# Patient Record
Sex: Male | Born: 1960 | Race: White | Hispanic: No | Marital: Married | Smoking: Former smoker
Health system: Southern US, Community
[De-identification: ages and names within clinical notes are randomized; demographics above are authoritative.]

## PROBLEM LIST (undated history)

## (undated) DIAGNOSIS — M722 Plantar fascial fibromatosis: Secondary | ICD-10-CM

## (undated) DIAGNOSIS — M1990 Unspecified osteoarthritis, unspecified site: Secondary | ICD-10-CM

## (undated) DIAGNOSIS — N182 Chronic kidney disease, stage 2 (mild): Secondary | ICD-10-CM

## (undated) DIAGNOSIS — M549 Dorsalgia, unspecified: Secondary | ICD-10-CM

## (undated) HISTORY — DX: Plantar fascial fibromatosis: M72.2

## (undated) HISTORY — DX: Dorsalgia, unspecified: M54.9

## (undated) HISTORY — DX: Unspecified osteoarthritis, unspecified site: M19.90

## (undated) HISTORY — DX: Chronic kidney disease, stage 2 (mild): N18.2

---

## 2008-06-12 ENCOUNTER — Emergency Department: Payer: Self-pay | Admitting: Internal Medicine

## 2008-12-10 ENCOUNTER — Ambulatory Visit: Payer: Self-pay | Admitting: General Practice

## 2010-03-24 ENCOUNTER — Inpatient Hospital Stay: Payer: Self-pay | Admitting: Internal Medicine

## 2010-03-28 ENCOUNTER — Ambulatory Visit: Payer: Self-pay | Admitting: Anesthesiology

## 2010-04-18 ENCOUNTER — Ambulatory Visit: Payer: Self-pay | Admitting: Anesthesiology

## 2010-05-17 ENCOUNTER — Ambulatory Visit: Payer: Self-pay | Admitting: Anesthesiology

## 2010-11-28 ENCOUNTER — Ambulatory Visit: Payer: Self-pay | Admitting: Anesthesiology

## 2011-11-05 ENCOUNTER — Ambulatory Visit: Payer: Self-pay | Admitting: Anesthesiology

## 2012-07-31 ENCOUNTER — Ambulatory Visit: Payer: Self-pay | Admitting: Anesthesiology

## 2013-03-18 ENCOUNTER — Ambulatory Visit: Payer: Self-pay | Admitting: General Practice

## 2014-04-13 ENCOUNTER — Ambulatory Visit
Admit: 2014-04-13 | Disposition: A | Discharge status: Home / Self Care | Payer: Self-pay | Attending: General Practice | Admitting: General Practice

## 2014-05-01 NOTE — Op Note (Signed)
PATIENT NAME:  Troy Hogan, Troy Hogan MR#:  [id number] DATE OF BIRTH:  January 07, 1961  DATE OF PROCEDURE:  03/18/2013  CONSULTING PHYSICIAN: Elby Blackwelder D. Clayborn Bigness, M.D.   INDICATION: Irregular heart beat and palpitations.   The patient wore the Holter for 48 hours. Total beats 234,024, minimum rate 49, maximum 161. Average 83, mostly sinus rhythm, rare PVCs, rare PACs. No runs. No pauses. No high-grade block. No ST segment changes.   IMPRESSION: Relatively benign Holter. No diary was submitted, would recommend conservative therapy at this point unless symptoms persist, worsen or recur.    ____________________________ Loran Senters. Clayborn Bigness, MD ddc:sg D: 03/21/2013 11:44:52 ET T: 03/22/2013 06:48:05 ET JOB#: [id number]  cc: Aasia Peavler D. Clayborn Bigness, MD, <Dictator> Yolonda Kida MD ELECTRONICALLY SIGNED 04/06/2013 7:06

## 2015-02-17 ENCOUNTER — Encounter: Payer: Self-pay | Admitting: Physician Assistant

## 2015-02-17 ENCOUNTER — Ambulatory Visit: Payer: Self-pay | Admitting: Physician Assistant

## 2015-02-17 VITALS — BP 110/70 | HR 81 | Temp 97.3°F

## 2015-02-17 DIAGNOSIS — M545 Low back pain, unspecified: Secondary | ICD-10-CM

## 2015-02-17 MED ORDER — METHYLPREDNISOLONE 4 MG PO TBPK: ORAL_TABLET | ORAL | Status: AC

## 2015-02-17 NOTE — Progress Notes (Signed)
S:  C/o low back pain for 3-4 days, no known injury, pain is worse with movement, increased with bending over, denies numbness, tingling, or changes in bowel/urinary habits,  Had uri last week with fever, chills, cough, thinks he coughed so much he pulled his back, Using otc meds without relief Remainder ros neg  O:  Vitals wnl, nad, ent wnl;  lungs c t a, cv rrr, spine nontender, muscles in lower back spasmed , decreased rom with,  Neg slr, pt walks without difficulty, no foot drop noted, n/v intact  A: acute back pain, muscle spasms  P: medrol dose pack, use wet heat followed by ice, stretches, return to clinic if not better in 3 t 5 days, return earlier if worsening

## 2016-05-23 ENCOUNTER — Other Ambulatory Visit: Payer: Self-pay

## 2016-05-23 VITALS — BP 130/80 | HR 78 | Temp 98.5°F | Ht 71.0 in | Wt 172.0 lb

## 2016-05-23 DIAGNOSIS — Z0000 Encounter for general adult medical examination without abnormal findings: Secondary | ICD-10-CM

## 2016-05-23 DIAGNOSIS — Z0189 Encounter for other specified special examinations: Secondary | ICD-10-CM

## 2016-05-23 DIAGNOSIS — Z008 Encounter for other general examination: Secondary | ICD-10-CM

## 2016-05-23 NOTE — Progress Notes (Signed)
S: pt here for wellness physical and biometrics for insurance purposes, states over the past 4 weeks he has had 4 episodes where he feels like he is going to black out then feels disoriented,  no other complaints ros neg. PMH:  Back pain which has improved with exercise  Social: former smoker, occ dip, 3-4 beers a week  Fam: father died of cirrhosis, was an alcoholic, mother is still living has dm, and htn, 1 sister and 2 brothers both have htn  O: vitals wnl, nad, ENT wnl, neck supple no lymph, lungs c t a, cv rrr, abd soft nontender bs normal all 4 quads  A: wellness, biometric physical  P: fasting labs, if glucose and A1C are normal will US carotids to check for stenosis

## 2016-05-28 LAB — CMP12+LP+TP+TSH+6AC+PSA+CBC…
ALBUMIN: 4.8 g/dL (ref 3.5–5.5)
ALK PHOS: 54 IU/L (ref 39–117)
ALT: 12 IU/L (ref 0–44)
AST: 15 IU/L (ref 0–40)
Albumin/Globulin Ratio: 2 (ref 1.2–2.2)
BASOS: 1 %
BUN / CREAT RATIO: 12 (ref 9–20)
BUN: 13 mg/dL (ref 6–24)
Basophils Absolute: 0 10*3/uL (ref 0.0–0.2)
Bilirubin Total: 0.2 mg/dL (ref 0.0–1.2)
CALCIUM: 9.3 mg/dL (ref 8.7–10.2)
CHLORIDE: 101 mmol/L (ref 96–106)
CHOL/HDL RATIO: 4 ratio (ref 0.0–5.0)
CHOLESTEROL TOTAL: 222 mg/dL — AB (ref 100–199)
Creatinine, Ser: 1.07 mg/dL (ref 0.76–1.27)
EOS (ABSOLUTE): 0.1 10*3/uL (ref 0.0–0.4)
EOS: 2 %
Estimated CHD Risk: 0.7 times avg. (ref 0.0–1.0)
FREE THYROXINE INDEX: 1.5 (ref 1.2–4.9)
GFR calc Af Amer: 90 mL/min/{1.73_m2} (ref >59)
GFR calc non Af Amer: 78 mL/min/{1.73_m2} (ref >59)
GGT: 22 IU/L (ref 0–65)
GLOBULIN, TOTAL: 2.4 g/dL (ref 1.5–4.5)
Glucose: 108 mg/dL — ABNORMAL HIGH (ref 65–99)
HDL: 56 mg/dL (ref >39)
HEMATOCRIT: 43.3 % (ref 37.5–51.0)
HEMOGLOBIN: 14.1 g/dL (ref 13.0–17.7)
Immature Grans (Abs): 0 10*3/uL (ref 0.0–0.1)
Immature Granulocytes: 0 %
Iron: 109 ug/dL (ref 38–169)
LDH: 170 IU/L (ref 121–224)
LDL CALC: 152 mg/dL — AB (ref 0–99)
LYMPHS ABS: 2 10*3/uL (ref 0.7–3.1)
Lymphs: 34 %
MCH: 31 pg (ref 26.6–33.0)
MCHC: 32.6 g/dL (ref 31.5–35.7)
MCV: 95 fL (ref 79–97)
Monocytes Absolute: 0.6 10*3/uL (ref 0.1–0.9)
Monocytes: 11 %
NEUTROS ABS: 3.1 10*3/uL (ref 1.4–7.0)
Neutrophils: 52 %
PHOSPHORUS: 2.6 mg/dL (ref 2.5–4.5)
POTASSIUM: 4.3 mmol/L (ref 3.5–5.2)
PROSTATE SPECIFIC AG, SERUM: 1.2 ng/mL (ref 0.0–4.0)
Platelets: 254 10*3/uL (ref 150–379)
RBC: 4.55 x10E6/uL (ref 4.14–5.80)
RDW: 14.7 % (ref 12.3–15.4)
SODIUM: 141 mmol/L (ref 134–144)
T3 Uptake Ratio: 27 % (ref 24–39)
T4, Total: 5.5 ug/dL (ref 4.5–12.0)
TOTAL PROTEIN: 7.2 g/dL (ref 6.0–8.5)
TRIGLYCERIDES: 72 mg/dL (ref 0–149)
TSH: 2.89 u[IU]/mL (ref 0.450–4.500)
Uric Acid: 5.6 mg/dL (ref 3.7–8.6)
VLDL Cholesterol Cal: 14 mg/dL (ref 5–40)
WBC: 5.9 10*3/uL (ref 3.4–10.8)

## 2016-05-28 LAB — HIV ANTIBODY (ROUTINE TESTING W REFLEX): HIV Screen 4th Generation wRfx: NONREACTIVE

## 2016-05-28 LAB — VITAMIN D 25 HYDROXY (VIT D DEFICIENCY, FRACTURES): VIT D 25 HYDROXY: 32.6 ng/mL (ref 30.0–100.0)

## 2016-05-28 LAB — HEPATITIS C ANTIBODY (REFLEX)

## 2016-05-28 LAB — HCV COMMENT:

## 2016-05-28 LAB — HGB A1C W/O EAG: HEMOGLOBIN A1C: 5.8 % — AB (ref 4.8–5.6)

## 2016-06-26 ENCOUNTER — Telehealth: Payer: Self-pay | Admitting: Emergency Medicine

## 2016-06-26 MED ORDER — INDOMETHACIN 25 MG PO CAPS: 25.0000 mg | ORAL_CAPSULE | Freq: Three times a day (TID) | ORAL | 3 refills | Status: DC

## 2016-06-26 NOTE — Telephone Encounter (Signed)
Patient called and expressed that his left bottom heel is painful.  Says he has gout and wanted to know if we can call in something to [facility] in [place].

## 2016-06-26 NOTE — Telephone Encounter (Signed)
Pt called and said he has gout in his toe again, ?if we could call in the "short acting medicine"

## 2016-06-28 IMAGING — CR DG CHEST 2V
1 series · 2 of 2 positions shown · non-contrast
Comparison: None.

CLINICAL DATA: Chest pain.

EXAM:
CHEST  2 VIEW

[Series 1: kdxr chest pa (or ap) and lat · 0.14mm/px · 2 of 2 slices shown]
[im 1/2]
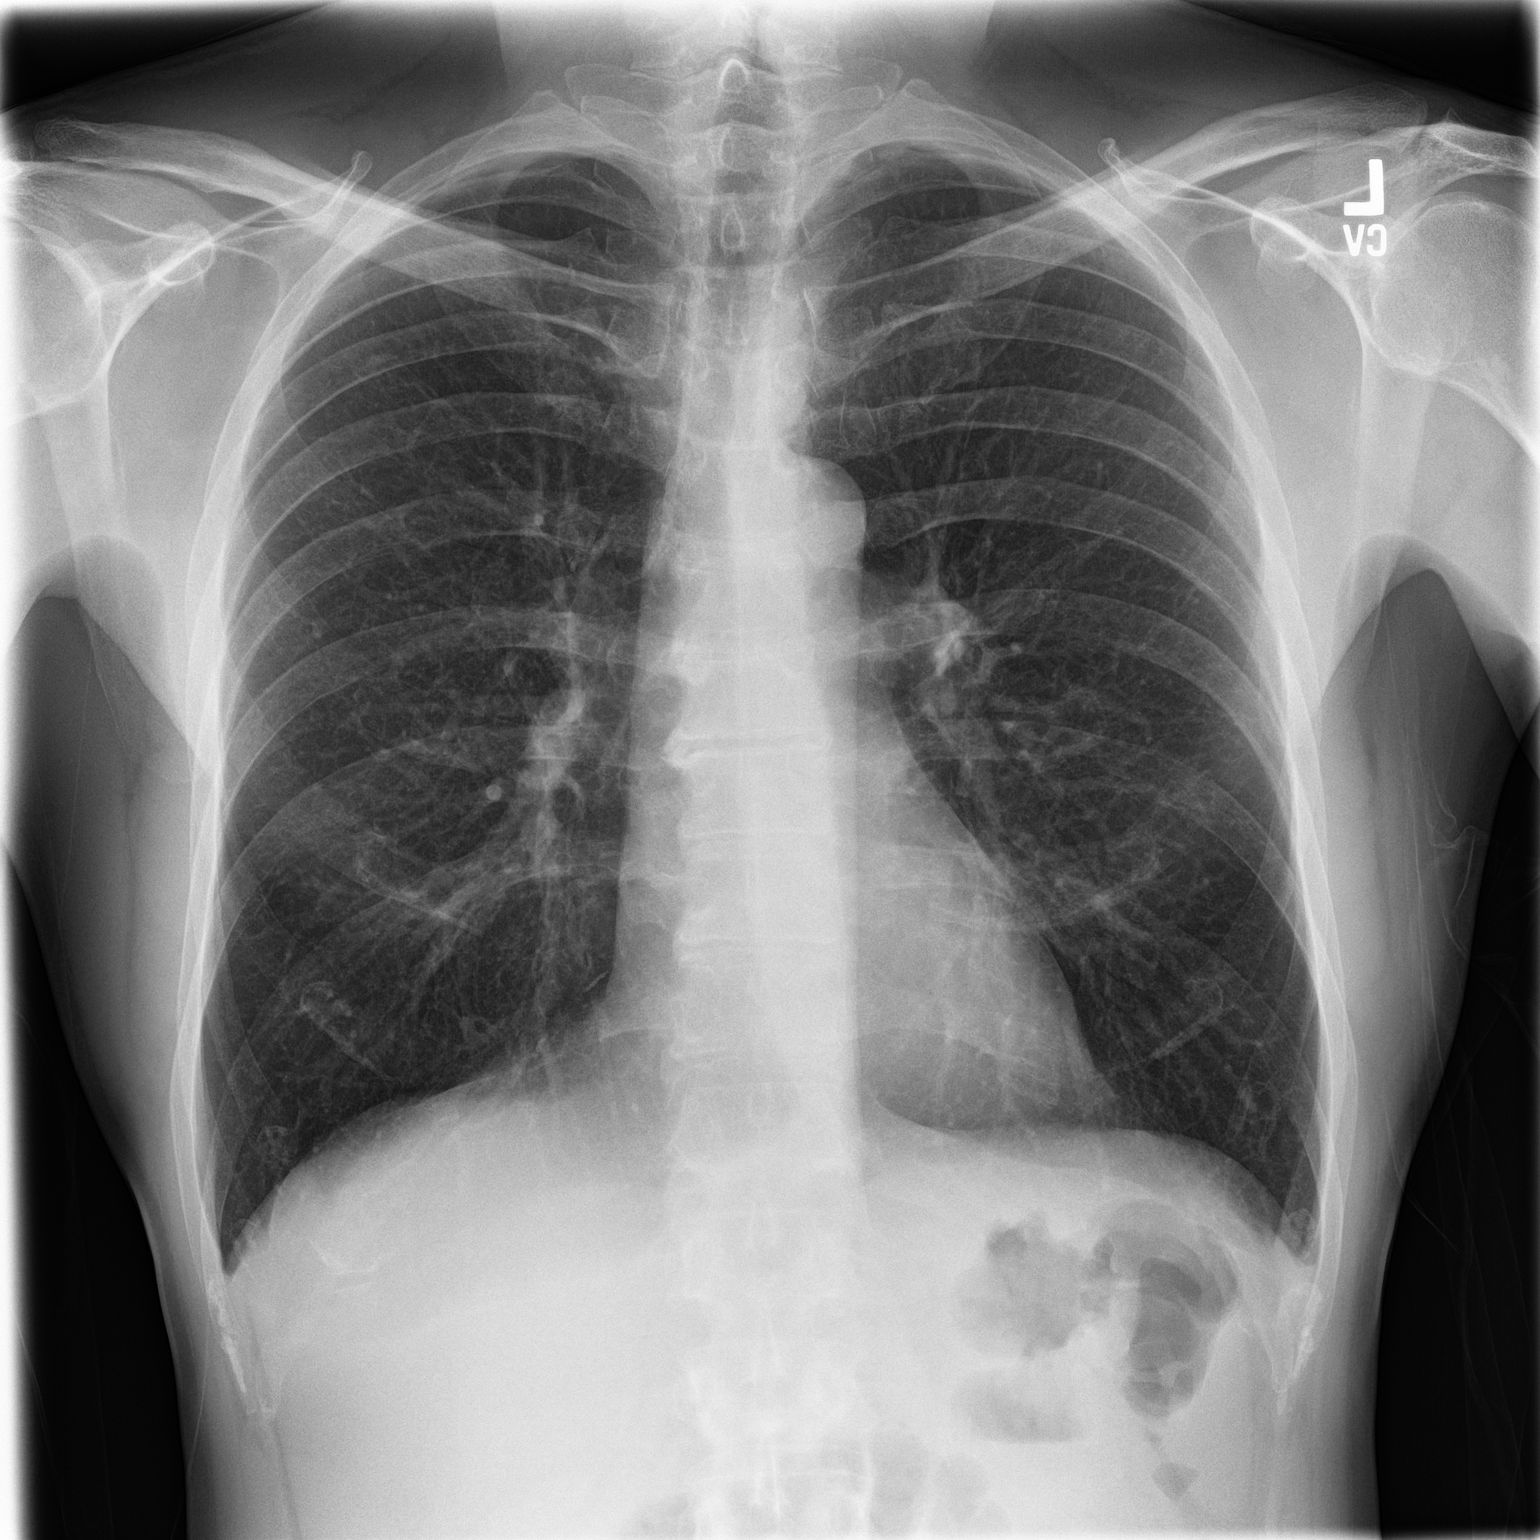
[im 2/2]
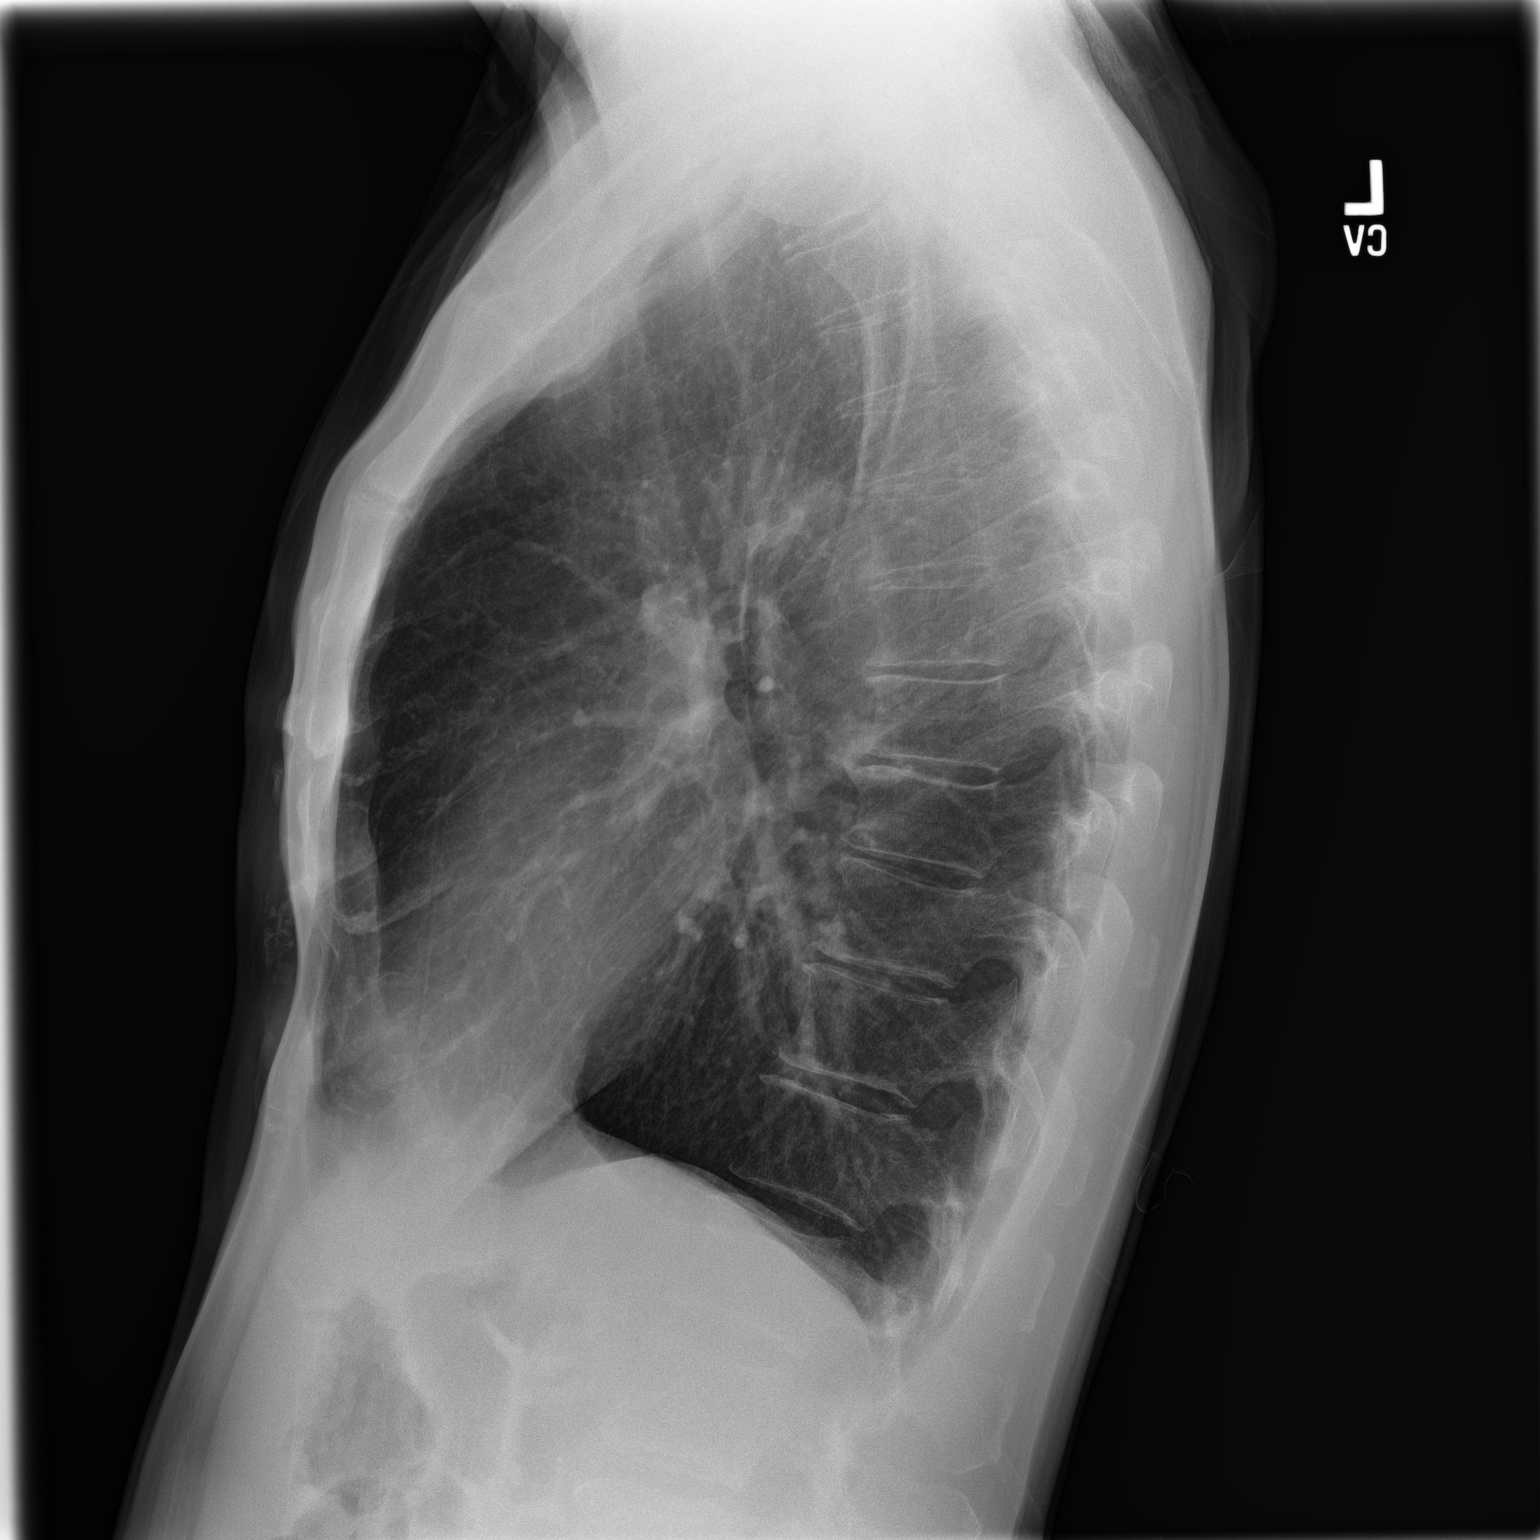

[2 of 2 positions shown; findings below may reference images not displayed]

FINDINGS: Mediastinum hilar structures normal. Lungs are clear. Heart size
normal. No pleural effusion or pneumothorax. No acute bony
abnormality.
IMPRESSION: No acute cardiopulmonary disease.

## 2016-08-27 ENCOUNTER — Encounter: Payer: Self-pay | Admitting: Physician Assistant

## 2016-08-27 ENCOUNTER — Ambulatory Visit: Payer: Self-pay | Admitting: Physician Assistant

## 2016-08-27 VITALS — BP 130/90 | HR 71 | Temp 98.1°F

## 2016-08-27 DIAGNOSIS — M722 Plantar fascial fibromatosis: Secondary | ICD-10-CM

## 2016-08-27 MED ORDER — MELOXICAM 15 MG PO TABS: 15.0000 mg | ORAL_TABLET | Freq: Every day | ORAL | 3 refills | Status: DC

## 2016-08-27 NOTE — Progress Notes (Signed)
S: pt c/o left foot pain, hurts in the heel to bw, worse in the mornings, still hurts throughout the day as he walks, tried otc heel lift, ice, and had some left over indocin which didn't help, no known injury  O: vitals wnl, nad, left foot is tender at heel and along area of plantar tendon, no achilles tenderness, full rom of foot, n/v intact  A: plantar fasciitis  P: mobic 15mg  qd, continue conservative measures, will refer to podiatry

## 2016-08-28 NOTE — Progress Notes (Signed)
Patient has been referred to see Dr. Jens Som at [facility] on 09/17/16 @ 10 am.  I have notified the patient and he has accepted the appointment.

## 2016-12-05 ENCOUNTER — Ambulatory Visit (INDEPENDENT_AMBULATORY_CARE_PROVIDER_SITE_OTHER): Payer: Managed Care, Other (non HMO)

## 2016-12-05 ENCOUNTER — Other Ambulatory Visit: Payer: Self-pay | Admitting: Podiatry

## 2016-12-05 ENCOUNTER — Ambulatory Visit: Payer: Managed Care, Other (non HMO) | Admitting: Podiatry

## 2016-12-05 ENCOUNTER — Encounter: Payer: Self-pay | Admitting: Podiatry

## 2016-12-05 VITALS — BP 133/83 | HR 78 | Resp 16

## 2016-12-05 DIAGNOSIS — M722 Plantar fascial fibromatosis: Secondary | ICD-10-CM

## 2016-12-05 DIAGNOSIS — M205X1 Other deformities of toe(s) (acquired), right foot: Secondary | ICD-10-CM

## 2016-12-05 MED ORDER — METHYLPREDNISOLONE 4 MG PO TBPK: ORAL_TABLET | ORAL | 0 refills | Status: DC

## 2016-12-05 NOTE — Progress Notes (Signed)
  Subjective:  Patient ID: Troy Hogan Reason, male    DOB: 06-05-1960,  MRN: [id number] HPI Chief Complaint  Patient presents with  . Foot Pain    Anterior ankle and lateral foot left - feels stiff and aching occasionall, says it feels like a sprained ankle when getting up to stand, very weak, no injury  . Foot Pain    1st MPJ right - limited ROM, uncomfortable walking    56 y.o. male presents with the above complaint.     No past medical history on file.   Current Outpatient Medications:  .  indomethacin (INDOCIN) 25 MG capsule, Take 1 capsule (25 mg total) by mouth 3 (three) times daily with meals. (Patient not taking: Reported on 08/27/2016), Disp: 30 capsule, Rfl: 3  Allergies  Allergen Reactions  . Codeine Nausea Only   Review of Systems  All other systems reviewed and are negative.  Objective:   Vitals:   12/05/16 1101  BP: 133/83  Pulse: 78  Resp: 16    General: Well developed, nourished, in no acute distress, alert and oriented x3   Dermatological: Skin is warm, dry and supple bilateral. Nails x 10 are well maintained; remaining integument appears unremarkable at this time. There are no open sores, no preulcerative lesions, no rash or signs of infection present.  Vascular: Dorsalis Pedis artery and Posterior Tibial artery pedal pulses are 2/4 bilateral with immedate capillary fill time. Pedal hair growth present. No varicosities and no lower extremity edema present bilateral.   Neruologic: Grossly intact via light touch bilateral. Vibratory intact via tuning fork bilateral. Protective threshold with Semmes Wienstein monofilament intact to all pedal sites bilateral. Patellar and Achilles deep tendon reflexes 2+ bilateral. No Babinski or clonus noted bilateral.   Musculoskeletal: No gross boney pedal deformities bilateral. No pain, crepitus, or limitation noted with foot and ankle range of motion bilateral. Muscular strength 5/5 in all groups tested bilateral.  Gait:  Unassisted, Nonantalgic.    Radiographs:  Regress taken today demonstrate osseously mature individual with severe osteoarthritic changes of the first metatarsophalangeal joint of the right foot no significant changes in the left foot.  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  Plan: Discussed the need for surgical intervention regarding the right foot. After thorough discussion of findings regarding the left foot we discussed an injection would be necessary for the lateral aspect of the foot. She understands this is amenable to it we injected 20 mg Kenalog 5 mg local anesthetic to the point of maximum tenderness overlying the fourth and fifth metatarsocuboid articulation left foot. I also would a prescription for a Medrol Dosepak to be followed by his anti-inflammatories from his previous doctor. I will follow-up with him in 6 weeks. We will discuss surgical intervention regarding his right foot in May.     Marquie Aderhold T. [place], [place]

## 2016-12-10 ENCOUNTER — Ambulatory Visit: Payer: Self-pay | Admitting: Podiatry

## 2016-12-12 ENCOUNTER — Ambulatory Visit: Payer: Self-pay | Admitting: Podiatry

## 2016-12-13 ENCOUNTER — Encounter: Payer: Self-pay | Admitting: Physician Assistant

## 2016-12-13 ENCOUNTER — Ambulatory Visit: Payer: Self-pay | Admitting: Physician Assistant

## 2016-12-13 VITALS — BP 140/90 | HR 80 | Temp 98.5°F | Resp 16

## 2016-12-13 DIAGNOSIS — M545 Low back pain, unspecified: Secondary | ICD-10-CM

## 2016-12-13 MED ORDER — METHYLPREDNISOLONE 4 MG PO TBPK: ORAL_TABLET | ORAL | 0 refills | Status: DC

## 2016-12-13 MED ORDER — CYCLOBENZAPRINE HCL 10 MG PO TABS: 10.0000 mg | ORAL_TABLET | Freq: Three times a day (TID) | ORAL | 0 refills | Status: DC | PRN

## 2016-12-13 NOTE — Progress Notes (Signed)
   Subjective: Back pain     Patient ID: Troy Hogan, male    DOB: 02-13-1960, 56 y.o.   MRN: [id number]  HPI Patient complaining of back pain which occurred just prior to arrival. Patient has a history of bulging disks causing intermittent back pain. Patient denies radicular component to his back pain. Patient denies bladder or bowel dysfunction.   Review of Systems Negative except for complaint    Objective:   Physical Exam No obvious deformity to the lumbar spine. Patient sits to stand up heavy reliance on upper extremity. Patient is moderate guarding palpation of L3-S1. Patient is Leg test. Patient and place with atypical gait.       Assessment & Plan: Acute back pain   Patient given discharge Instructions. Patient prescription for Flexeril and Medrol Dosepak. Patient advised follow-up PCP if no improvement in 5 days.

## 2017-01-14 ENCOUNTER — Encounter (INDEPENDENT_AMBULATORY_CARE_PROVIDER_SITE_OTHER): Payer: Managed Care, Other (non HMO) | Admitting: Podiatry

## 2017-01-14 NOTE — Progress Notes (Signed)
This encounter was created in error - please disregard.

## 2017-03-29 ENCOUNTER — Other Ambulatory Visit: Payer: Self-pay | Admitting: Family Medicine

## 2017-03-29 DIAGNOSIS — G8929 Other chronic pain: Secondary | ICD-10-CM

## 2017-03-29 NOTE — Progress Notes (Signed)
Patient called requesting a referral to go to [facility] pain management clinic, where he was seen before.  Patient reports that he needs a referral since he has not been seen there in 3 years.  Referral placed.

## 2017-05-06 ENCOUNTER — Ambulatory Visit: Payer: Self-pay | Admitting: Anesthesiology

## 2018-02-07 ENCOUNTER — Encounter: Payer: Self-pay | Admitting: Family Medicine

## 2018-02-07 ENCOUNTER — Ambulatory Visit (INDEPENDENT_AMBULATORY_CARE_PROVIDER_SITE_OTHER): Payer: Managed Care, Other (non HMO) | Admitting: Family Medicine

## 2018-02-07 VITALS — BP 138/80 | HR 88 | Temp 97.8°F | Resp 12 | Ht 67.0 in | Wt 165.4 lb

## 2018-02-07 DIAGNOSIS — Z125 Encounter for screening for malignant neoplasm of prostate: Secondary | ICD-10-CM

## 2018-02-07 DIAGNOSIS — I10 Essential (primary) hypertension: Secondary | ICD-10-CM | POA: Diagnosis not present

## 2018-02-07 DIAGNOSIS — Z0000 Encounter for general adult medical examination without abnormal findings: Secondary | ICD-10-CM | POA: Insufficient documentation

## 2018-02-07 DIAGNOSIS — N529 Male erectile dysfunction, unspecified: Secondary | ICD-10-CM | POA: Diagnosis not present

## 2018-02-07 DIAGNOSIS — Z23 Encounter for immunization: Secondary | ICD-10-CM

## 2018-02-07 DIAGNOSIS — Z1211 Encounter for screening for malignant neoplasm of colon: Secondary | ICD-10-CM | POA: Diagnosis not present

## 2018-02-07 LAB — COMPLETE METABOLIC PANEL WITH GFR
AG Ratio: 2.2 (calc) (ref 1.0–2.5)
ALT: 21 U/L (ref 9–46)
AST: 20 U/L (ref 10–35)
Albumin: 4.8 g/dL (ref 3.6–5.1)
Alkaline phosphatase (APISO): 46 U/L (ref 40–115)
BUN: 18 mg/dL (ref 7–25)
CALCIUM: 9.8 mg/dL (ref 8.6–10.3)
CO2: 27 mmol/L (ref 20–32)
CREATININE: 1.13 mg/dL (ref 0.70–1.33)
Chloride: 103 mmol/L (ref 98–110)
GFR, EST AFRICAN AMERICAN: 84 mL/min/{1.73_m2} (ref >=60)
GFR, EST NON AFRICAN AMERICAN: 73 mL/min/{1.73_m2} (ref >=60)
Globulin: 2.2 g/dL (calc) (ref 1.9–3.7)
Glucose, Bld: 92 mg/dL (ref 65–99)
Potassium: 4.4 mmol/L (ref 3.5–5.3)
Sodium: 138 mmol/L (ref 135–146)
TOTAL PROTEIN: 7 g/dL (ref 6.1–8.1)
Total Bilirubin: 0.4 mg/dL (ref 0.2–1.2)

## 2018-02-07 LAB — CBC
HCT: 40.4 % (ref 38.5–50.0)
Hemoglobin: 13.6 g/dL (ref 13.2–17.1)
MCH: 32.4 pg (ref 27.0–33.0)
MCHC: 33.7 g/dL (ref 32.0–36.0)
MCV: 96.2 fL (ref 80.0–100.0)
MPV: 10.3 fL (ref 7.5–12.5)
PLATELETS: 262 10*3/uL (ref 140–400)
RBC: 4.2 10*6/uL (ref 4.20–5.80)
RDW: 13.6 % (ref 11.0–15.0)
WBC: 7 10*3/uL (ref 3.8–10.8)

## 2018-02-07 LAB — LIPID PANEL
Cholesterol: 242 mg/dL — ABNORMAL HIGH (ref <200)
HDL: 64 mg/dL (ref >40)
LDL CHOLESTEROL (CALC): 158 mg/dL — AB
Non-HDL Cholesterol (Calc): 178 mg/dL (calc) — ABNORMAL HIGH (ref <130)
Total CHOL/HDL Ratio: 3.8 (calc) (ref <5.0)
Triglycerides: 94 mg/dL (ref <150)

## 2018-02-07 LAB — PSA: PSA: 1.2 ng/mL (ref <=4.0)

## 2018-02-07 MED ORDER — LOSARTAN POTASSIUM 25 MG PO TABS: 25.0000 mg | ORAL_TABLET | Freq: Every day | ORAL | 0 refills | Status: DC

## 2018-02-07 MED ORDER — SILDENAFIL CITRATE 50 MG PO TABS: 50.0000 mg | ORAL_TABLET | Freq: Every day | ORAL | 1 refills | Status: DC | PRN

## 2018-02-07 NOTE — Assessment & Plan Note (Signed)
Return in a few weeks for the physical

## 2018-02-07 NOTE — Progress Notes (Signed)
BP 138/80   Pulse 88   Temp 97.8 F (36.6 C) (Oral)   Resp 12   Ht 5\' 7"  (1.702 m)   Wt 165 lb 6.4 oz (75 kg)   SpO2 98%   BMI 25.91 kg/m    Subjective:    Patient ID: Troy Hogan, male    DOB: Nov 25, 1960, 58 y.o.   MRN: [id number]  HPI: Troy Hogan is a 58 y.o. male  Chief Complaint  Patient presents with  . Establish Care  . Erectile Dysfunction    wants to discuss    HPI Patient is here to establish care He is retired now, Runner, broadcasting/film/video work, retired from Counsellor to get healthier Occasional cigars Eats what he wants to, grills out a lot, sister and mother talk to him about his health Mother (89 years old) urged him to get a flu shot Stays active, constantly going, never had a weight problem Does not sit a lot  Eats red meat "when I can"; more rotisserie chicken; knows hamburgers aren't good  Erectile dysfunction; thinks it is age; morning erections; partial erection; loses some erection Sex drive is okay Checked 4-5 years ago, [facility] doctor did stress test, felt it was stress from job; healthy heart Maybe high cholesterol HTN in the family; not sure about high cholesterol; mother and sister and older brother are pretty health, except older brother is having heart issues, monitoring; no heart attack Middle brother is a full blown alcoholic; father drank after WWII No prescriptions tried; something ordered one time, just something OTC Both brothers are on BP medicine  One BP was 145/94 on Sept 6, 2018 One BP was 130/90 on Aug 27, 2016  Has three bulging discs in his back; they treat that with prednisone at times; MRI about 7 years ago; lower back; goes out every now and then; tying shoes in the kitchen was all it took October 31st and it went out  His vision is sometimes getting different readings; went to [facility]; too many steroids they mixed with flexeril, lost vision for 3 months; Dr. Sandra Cockayne was his doctor then; some mornings are blurry;  they thought BP going up and down, seeing optometrists; pressures were different in the eyes, he thinks problem with pressure  Surgery with Dr. Milinda Pointer in 2-3 weeks  Depression screen [facility] 2/9 02/07/2018  Decreased Interest 0  Down, Depressed, Hopeless 0  PHQ - 2 Score 0  Altered sleeping 0  Tired, decreased energy 0  Change in appetite 0  Feeling bad or failure about yourself  0  Trouble concentrating 0  Moving slowly or fidgety/restless 0  Suicidal thoughts 0  PHQ-9 Score 0  Difficult doing work/chores Not difficult at all   Fall Risk  02/07/2018  Falls in the past year? 0  Number falls in past yr: 0  Injury with Fall? 0    Relevant past medical, surgical, family and social history reviewed Past Medical History:  Diagnosis Date  . Arthritis   . Back pain   . CKD (chronic kidney disease) stage 2, GFR 60-89 ml/min 02/11/2018  . Plantar fasciitis     Family History  Problem Relation Age of Onset  . Diabetes Mother   . Hypertension Mother   . Cirrhosis Father   . Alcohol abuse Father   . Hypertension Sister   . Hypertension Brother   . Stroke Paternal Grandmother   . Hypertension Brother    Social History   Tobacco  Use  . Smoking status: Former Smoker    Types: Cigars  . Smokeless tobacco: Former Systems developer    Types: Snuff    Quit date: 12/27/2017  . Tobacco comment: 1 week ago  Substance Use Topics  . Alcohol use: Yes    Alcohol/week: 3.0 standard drinks    Types: 3 Cans of beer per week    Comment: 3 beers every other day  . Drug use: No     Office Visit from 02/07/2018 in [facility]  AUDIT-C Score  3      Interim medical history since last visit reviewed. Allergies and medications reviewed  Review of Systems Per HPI unless specifically indicated above     Objective:    BP 138/80   Pulse 88   Temp 97.8 F (36.6 C) (Oral)   Resp 12   Ht 5\' 7"  (1.702 m)   Wt 165 lb 6.4 oz (75 kg)   SpO2 98%   BMI 25.91 kg/m   Wt Readings from  Last 3 Encounters:  02/07/18 165 lb 6.4 oz (75 kg)  05/23/16 172 lb (78 kg)    Physical Exam Constitutional:      General: He is not in acute distress.    Appearance: He is well-developed.  HENT:     Head: Normocephalic and atraumatic.  Eyes:     General: No scleral icterus. Neck:     Thyroid: No thyromegaly.  Cardiovascular:     Rate and Rhythm: Normal rate and regular rhythm.  Pulmonary:     Effort: Pulmonary effort is normal.     Breath sounds: Normal breath sounds.  Abdominal:     General: Bowel sounds are normal. There is no distension.     Palpations: Abdomen is soft.  Skin:    General: Skin is warm and dry.     Coloration: Skin is not pale.  Neurological:     Mental Status: He is alert.     Coordination: Coordination normal.  Psychiatric:        Behavior: Behavior normal.        Thought Content: Thought content normal.        Judgment: Judgment normal.        Assessment & Plan:   Problem List Items Addressed This Visit      Cardiovascular and Mediastinum   Essential hypertension, benign - Primary (Chronic)    Reviewed prior BP readings; he does meet criteria for hypertension; dsicussed DASH guidelines; we opted to start low dose ARB; return in 2 weeks for physical and labs      Relevant Medications   losartan (COZAAR) 25 MG tablet     Other   Preventative health care    Return in a few weeks for the physical      Relevant Orders   CBC (Completed)   COMPLETE METABOLIC PANEL WITH GFR (Completed)   Lipid panel (Completed)   Erectile dysfunction (Chronic)    He'll try more plant-based diet       Other Visit Diagnoses    Screen for colon cancer       Relevant Orders   Ambulatory referral to Gastroenterology   Need for influenza vaccination       Relevant Orders   Flu Vaccine QUAD 6+ mos PF IM (Fluarix Quad PF) (Completed)   Prostate cancer screening       Relevant Orders   PSA (Completed)       Follow up plan: Return in about 2  weeks  (around 02/21/2018) for complete physical, with NON-fasting labs.  An after-visit summary was printed and given to the patient at [place].  Please see the patient instructions which may contain other information and recommendations beyond what is mentioned above in the assessment and plan.  Meds ordered this encounter  Medications  . losartan (COZAAR) 25 MG tablet    Sig: Take 1 tablet (25 mg total) by mouth daily.    Dispense:  30 tablet    Refill:  0  . DISCONTD: sildenafil (VIAGRA) 50 MG tablet    Sig: Take 1 tablet (50 mg total) by mouth daily as needed for erectile dysfunction.    Dispense:  10 tablet    Refill:  1    Orders Placed This Encounter  Procedures  . Flu Vaccine QUAD 6+ mos PF IM (Fluarix Quad PF)  . CBC  . COMPLETE METABOLIC PANEL WITH GFR  . Lipid panel  . PSA  . Ambulatory referral to Gastroenterology

## 2018-02-07 NOTE — Patient Instructions (Addendum)
Try to follow the DASH guidelines (DASH stands for Dietary Approaches to Stop Hypertension). Try to limit the sodium in your diet to no more than 1,500mg  of sodium per day. Certainly try to not exceed 2,000 mg per day at the very most. Do not add salt when cooking or at the table.  Check the sodium amount on labels when shopping, and choose items lower in sodium when given a choice. Avoid or limit foods that already contain a lot of sodium. Eat a diet rich in fruits and vegetables and whole grains, and try to lose weight if overweight or obese  Try to limit saturated fats in your diet (bologna, hot dogs, barbeque, cheeseburgers, hamburgers, steak, bacon, sausage, cheese, etc.) and get more fresh fruits, vegetables, and whole grains    DASH Eating Plan DASH stands for "Dietary Approaches to Stop Hypertension." The DASH eating plan is a healthy eating plan that has been shown to reduce high blood pressure (hypertension). It may also reduce your risk for type 2 diabetes, heart disease, and stroke. The DASH eating plan may also help with weight loss. What are tips for following this plan?  General guidelines  Avoid eating more than 2,300 mg (milligrams) of salt (sodium) a day. If you have hypertension, you may need to reduce your sodium intake to 1,500 mg a day.  Limit alcohol intake to no more than 1 drink a day for nonpregnant women and 2 drinks a day for men. One drink equals 12 oz of beer, 5 oz of wine, or 1 oz of hard liquor.  Work with your health care provider to maintain a healthy body weight or to lose weight. Ask what an ideal weight is for you.  Get at least 30 minutes of exercise that causes your heart to beat faster (aerobic exercise) most days of the week. Activities may include walking, swimming, or biking.  Work with your health care provider or diet and nutrition specialist (dietitian) to adjust your eating plan to your individual calorie needs. Reading food labels   Check food  labels for the amount of sodium per serving. Choose foods with less than 5 percent of the Daily Value of sodium. Generally, foods with less than 300 mg of sodium per serving fit into this eating plan.  To find whole grains, look for the word "whole" as the first word in the ingredient list. Shopping  Buy products labeled as "low-sodium" or "no salt added."  Buy fresh foods. Avoid canned foods and premade or frozen meals. Cooking  Avoid adding salt when cooking. Use salt-free seasonings or herbs instead of table salt or sea salt. Check with your health care provider or pharmacist before using salt substitutes.  Do not fry foods. Cook foods using healthy methods such as baking, boiling, grilling, and broiling instead.  Cook with heart-healthy oils, such as olive, canola, soybean, or sunflower oil. Meal planning  Eat a balanced diet that includes: ? 5 or more servings of fruits and vegetables each day. At each meal, try to fill half of your plate with fruits and vegetables. ? Up to 6-8 servings of whole grains each day. ? Less than 6 oz of lean meat, poultry, or fish each day. A 3-oz serving of meat is about the same size as a deck of cards. One egg equals 1 oz. ? 2 servings of low-fat dairy each day. ? A serving of nuts, seeds, or beans 5 times each week. ? Heart-healthy fats. Healthy fats called Omega-3 fatty acids  are found in foods such as flaxseeds and coldwater fish, like sardines, salmon, and mackerel.  Limit how much you eat of the following: ? Canned or prepackaged foods. ? Food that is high in trans fat, such as fried foods. ? Food that is high in saturated fat, such as fatty meat. ? Sweets, desserts, sugary drinks, and other foods with added sugar. ? Full-fat dairy products.  Do not salt foods before eating.  Try to eat at least 2 vegetarian meals each week.  Eat more home-cooked food and less restaurant, buffet, and fast food.  When eating at a restaurant, ask that your  food be prepared with less salt or no salt, if possible. What foods are recommended? The items listed may not be a complete list. Talk with your dietitian about what dietary choices are best for you. Grains Whole-grain or whole-wheat bread. Whole-grain or whole-wheat pasta. Brown rice. [place] Morrow. Bulgur. Whole-grain and low-sodium cereals. Pita bread. Low-fat, low-sodium crackers. Whole-wheat flour tortillas. Vegetables Fresh or frozen vegetables (raw, steamed, roasted, or grilled). Low-sodium or reduced-sodium tomato and vegetable juice. Low-sodium or reduced-sodium tomato sauce and tomato paste. Low-sodium or reduced-sodium canned vegetables. Fruits All fresh, dried, or frozen fruit. Canned fruit in natural juice (without added sugar). Meat and other protein foods Skinless chicken or [place]. Ground chicken or [place]. Pork with fat trimmed off. Fish and seafood. Egg whites. Dried beans, peas, or lentils. Unsalted nuts, nut butters, and seeds. Unsalted canned beans. Lean cuts of beef with fat trimmed off. Low-sodium, lean deli meat. Dairy Low-fat (1%) or fat-free (skim) milk. Fat-free, low-fat, or reduced-fat cheeses. Nonfat, low-sodium ricotta or cottage cheese. Low-fat or nonfat yogurt. Low-fat, low-sodium cheese. Fats and oils Soft margarine without trans fats. Vegetable oil. Low-fat, reduced-fat, or light mayonnaise and salad dressings (reduced-sodium). Canola, safflower, olive, soybean, and sunflower oils. Avocado. Seasoning and other foods Herbs. Spices. Seasoning mixes without salt. Unsalted popcorn and pretzels. Fat-free sweets. What foods are not recommended? The items listed may not be a complete list. Talk with your dietitian about what dietary choices are best for you. Grains Baked goods made with fat, such as croissants, muffins, or some breads. Dry pasta or rice meal packs. Vegetables Creamed or fried vegetables. Vegetables in a cheese sauce. Regular canned vegetables (not  low-sodium or reduced-sodium). Regular canned tomato sauce and paste (not low-sodium or reduced-sodium). Regular tomato and vegetable juice (not low-sodium or reduced-sodium). Angie Fava. Olives. Fruits Canned fruit in a light or heavy syrup. Fried fruit. Fruit in cream or butter sauce. Meat and other protein foods Fatty cuts of meat. Ribs. Fried meat. Berniece Salines. Sausage. Bologna and other processed lunch meats. Salami. Fatback. Hotdogs. Bratwurst. Salted nuts and seeds. Canned beans with added salt. Canned or smoked fish. Whole eggs or egg yolks. Chicken or [place] with skin. Dairy Whole or 2% milk, cream, and half-and-half. Whole or full-fat cream cheese. Whole-fat or sweetened yogurt. Full-fat cheese. Nondairy creamers. Whipped toppings. Processed cheese and cheese spreads. Fats and oils Butter. Stick margarine. Lard. Shortening. Ghee. Bacon fat. Tropical oils, such as coconut, palm kernel, or palm oil. Seasoning and other foods Salted popcorn and pretzels. Onion salt, garlic salt, seasoned salt, table salt, and sea salt. Worcestershire sauce. Tartar sauce. Barbecue sauce. Teriyaki sauce. Soy sauce, including reduced-sodium. Steak sauce. Canned and packaged gravies. Fish sauce. Oyster sauce. Cocktail sauce. Horseradish that you find on the shelf. Ketchup. Mustard. Meat flavorings and tenderizers. Bouillon cubes. Hot sauce and Tabasco sauce. Premade or packaged marinades. Premade or packaged taco  seasonings. Relishes. Regular salad dressings. Where to find more information:  National Heart, Lung, and [place]: https://wilson-eaton.com/  American Heart Association: www.heart.org Summary  The DASH eating plan is a healthy eating plan that has been shown to reduce high blood pressure (hypertension). It may also reduce your risk for type 2 diabetes, heart disease, and stroke.  With the DASH eating plan, you should limit salt (sodium) intake to 2,300 mg a day. If you have hypertension, you may need to reduce  your sodium intake to 1,500 mg a day.  When on the DASH eating plan, aim to eat more fresh fruits and vegetables, whole grains, lean proteins, low-fat dairy, and heart-healthy fats.  Work with your health care provider or diet and nutrition specialist (dietitian) to adjust your eating plan to your individual calorie needs. This information is not intended to replace advice given to you by your health care provider. Make sure you discuss any questions you have with your health care provider. Document Released: 12/14/2010 Document Revised: 12/19/2015 Document Reviewed: 12/19/2015 Elsevier Interactive Patient Education  2019 [employer].  Avoid salt substitutes, those can cause dangerously high potassium levels on your medicine

## 2018-02-10 ENCOUNTER — Encounter: Payer: Self-pay | Admitting: Podiatry

## 2018-02-10 ENCOUNTER — Ambulatory Visit (INDEPENDENT_AMBULATORY_CARE_PROVIDER_SITE_OTHER): Payer: Managed Care, Other (non HMO)

## 2018-02-10 ENCOUNTER — Ambulatory Visit: Payer: Managed Care, Other (non HMO) | Admitting: Podiatry

## 2018-02-10 DIAGNOSIS — M205X1 Other deformities of toe(s) (acquired), right foot: Secondary | ICD-10-CM

## 2018-02-10 NOTE — Progress Notes (Signed)
He presents today and states that is time to get this foot fixed.  He states that I can hardly stand walking on it anymore and is limiting my ability to perform a daily activities including exercise and my jobs.  He denies changes in his past medical history medications allergies surgeries and social history though he does state that he has retired since seeing me last.  Objective: Vital signs are stable alert and oriented x3.  Strong palpable pulses first metatarsal phalangeal joint of the right foot.  This is exquisitely painful and limited severely on range of motion.  Radiographs taken today demonstrate significant osteoarthritis of the first metatarsal phalangeal joint.  Assessment: Hallux rigidus first metatarsophalangeal joint right with pain.  Plan: After thorough discussion we discussed surgical consent today consisting of a Keller arthroplasty with a single silicone implant right foot.  I answered all questions regarding this procedure to the best of my ability in layman's terms.  He understood was amenable to it and signed all 3 pages a consent form.  We did discuss the possible postop complications which may include but not limited to postop pain bleeding swelling infection recurrence need for further surgery.  We dispensed a Cam walker information regarding the surgery center the morning of preop and anesthesia group.  Follow-up with him in 1 month

## 2018-02-10 NOTE — Patient Instructions (Signed)
Pre-Operative Instructions  Congratulations, you have decided to take an important step towards improving your quality of life.  You can be assured that the doctors and staff at [facility] will be with you every step of the way.  Here are some important things you should know:  1. Plan to be at the surgery center/hospital at least 1 (one) hour prior to your scheduled time, unless otherwise directed by the surgical center/hospital staff.  You must have a responsible adult accompany you, remain during the surgery and drive you home.  Make sure you have directions to the surgical center/hospital to ensure you arrive on time. 2. If you are having surgery at [facility] or [facility], you will need a copy of your medical history and physical form from your family physician within one month prior to the date of surgery. We will give you a form for your primary physician to complete.  3. We make every effort to accommodate the date you request for surgery.  However, there are times where surgery dates or times have to be moved.  We will contact you as soon as possible if a change in schedule is required.   4. No aspirin/ibuprofen for one week before surgery.  If you are on aspirin, any non-steroidal anti-inflammatory medications (Mobic, Aleve, Ibuprofen) should not be taken seven (7) days prior to your surgery.  You make take Tylenol for pain prior to surgery.  5. Medications - If you are taking daily heart and blood pressure medications, seizure, reflux, allergy, asthma, anxiety, pain or diabetes medications, make sure you notify the surgery center/hospital before the day of surgery so they can tell you which medications you should take or avoid the day of surgery. 6. No food or drink after midnight the night before surgery unless directed otherwise by surgical center/hospital staff. 7. No alcoholic beverages 24-hours prior to surgery.  No smoking 24-hours prior or 24-hours after  surgery. 8. Wear loose pants or shorts. They should be loose enough to fit over bandages, boots, and casts. 9. Don't wear slip-on shoes. Sneakers are preferred. 10. Bring your boot with you to the surgery center/hospital.  Also bring crutches or a walker if your physician has prescribed it for you.  If you do not have this equipment, it will be provided for you after surgery. 11. If you have not been contacted by the surgery center/hospital by the day before your surgery, call to confirm the date and time of your surgery. 12. Leave-time from work may vary depending on the type of surgery you have.  Appropriate arrangements should be made prior to surgery with your employer. 13. Prescriptions will be provided immediately following surgery by your doctor.  Fill these as soon as possible after surgery and take the medication as directed. Pain medications will not be refilled on weekends and must be approved by the doctor. 14. Remove nail polish on the operative foot and avoid getting pedicures prior to surgery. 15. Wash the night before surgery.  The night before surgery wash the foot and leg well with water and the antibacterial soap provided. Be sure to pay special attention to beneath the toenails and in between the toes.  Wash for at least three (3) minutes. Rinse thoroughly with water and dry well with a towel.  Perform this wash unless told not to do so by your physician.  Enclosed: 1 Ice pack (please put in freezer the night before surgery)   1 Hibiclens skin cleaner     Pre-op instructions  If you have any questions regarding the instructions, please do not hesitate to call our office.  [place]: [address], , [place] [address] -- [phone]  [place]: [address] [place], [place] [address] -- [phone]  [place]: [address]  [place], [place] [address] -- [phone]  [place]: [address], [address], [place], [place] [address] -- [phone]  Website:  [email or web address] 

## 2018-02-11 ENCOUNTER — Other Ambulatory Visit: Payer: Self-pay | Admitting: Family Medicine

## 2018-02-11 ENCOUNTER — Encounter: Payer: Self-pay | Admitting: Family Medicine

## 2018-02-11 ENCOUNTER — Telehealth: Payer: Self-pay | Admitting: *Deleted

## 2018-02-11 DIAGNOSIS — E7800 Pure hypercholesterolemia, unspecified: Secondary | ICD-10-CM

## 2018-02-11 DIAGNOSIS — N182 Chronic kidney disease, stage 2 (mild): Secondary | ICD-10-CM

## 2018-02-11 HISTORY — DX: Chronic kidney disease, stage 2 (mild): N18.2

## 2018-02-11 MED ORDER — ATORVASTATIN CALCIUM 10 MG PO TABS: 10.0000 mg | ORAL_TABLET | Freq: Every day | ORAL | 1 refills | Status: DC

## 2018-02-11 MED ORDER — ASPIRIN EC 81 MG PO TBEC: 81.0000 mg | DELAYED_RELEASE_TABLET | Freq: Every day | ORAL | Status: AC

## 2018-02-11 NOTE — Progress Notes (Signed)
Aspirin for cardioprotection

## 2018-02-11 NOTE — Progress Notes (Signed)
Troy Hogan, please let the patient know that his CBC is normal; glucose is normal; prostate test is in the normal range; liver enzymes are normal Kidneys aren't working like they did when he was 58 years old; avoid NSAIDs and stay well-hydrated; he's technically in stage 2 CKD so we want to slow this from getting worse over the coming decades; it's mild and common, but we do pay attention to it; the new blood pressure medicine should actually help protect his kidneys His cholesterol is high, high enough to warrant a statin; if he agrees, I'd like him to start a medicine to help lower his cholesterol and hopefully reduce the build up of plaque which can lead to heart attacks and strokes down the road; recheck lipids in 6-8 weeks (I'll order) The 10-year ASCVD risk score Troy Hogan DC Brooke Bonito., et al., 2013) is: 7.5%   Values used to calculate the score:     Age: 45 years     Sex: Male     Is Non-Hispanic African American: No     Diabetic: No     Tobacco smoker: No     Systolic Blood Pressure: 761 mmHg     Is BP treated: Yes     HDL Cholesterol: 64 mg/dL     Total Cholesterol: 242 mg/dL

## 2018-02-11 NOTE — Progress Notes (Signed)
Start statin (recommended in lab result note) Check lipids in 6-8 weeks

## 2018-02-11 NOTE — Telephone Encounter (Signed)
"  If you would please return my call.  I need to schedule a surgery appointment, preferably after the February 15.  I'd like to do it on a Monday if possible on the seventeenth or the twenty-fourth.  Give me a call back so I can make this appointment."  I'm returning your call.  Unfortunately, Dr. Milinda Pointer only does surgeries on Fridays.  He does not have anything available on February 28.  He can do your surgery on February 21.  "Okay, let's schedule it for the twenty-first."  I'll get it scheduled, someone from the surgical center will give you a call a day or two prior to your surgical date.  They will give you your arrival time.  You need to go online and register with the surgical center, instructions are in the brochure that we gave you.

## 2018-02-17 ENCOUNTER — Encounter: Payer: Self-pay | Admitting: *Deleted

## 2018-02-17 DIAGNOSIS — I10 Essential (primary) hypertension: Secondary | ICD-10-CM | POA: Insufficient documentation

## 2018-02-17 DIAGNOSIS — N529 Male erectile dysfunction, unspecified: Secondary | ICD-10-CM | POA: Insufficient documentation

## 2018-02-17 NOTE — Assessment & Plan Note (Signed)
Reviewed prior BP readings; he does meet criteria for hypertension; dsicussed DASH guidelines; we opted to start low dose ARB; return in 2 weeks for physical and labs

## 2018-02-17 NOTE — Assessment & Plan Note (Signed)
He'll try more plant-based diet

## 2018-02-18 ENCOUNTER — Telehealth: Payer: Self-pay | Admitting: Family Medicine

## 2018-02-18 NOTE — Telephone Encounter (Signed)
Patient called, left VM to return call to the office for lab results. See result notes for entire lab result note from Dr. Sanda Klein.

## 2018-02-18 NOTE — Telephone Encounter (Signed)
Pt given lab results per notes of Dr. Sanda Klein on 02/11/18. Pt verbalized understanding. He says yes to the statin medication. I advised it will be sent to his preferred pharmacy and to just come in to the lab in 6-8 weeks, no appointment necessary, he verbalized understanding.  Pharmacy: [facility], [place] [phone] (Phone) [phone] (Fax)

## 2018-02-18 NOTE — Telephone Encounter (Signed)
Pt called back to get results.  Copied from [place] [id number]. Topic: Quick Communication - Lab Results (Clinic Use ONLY) >> Feb 12, 2018  1:29 PM Sibyl Parr, CMA wrote: Called patient to inform them of  lab results. When patient returns call, triage nurse may disclose results.  Please notify patient to start taking an 81mg  baby aspirin daily per Dr. Sanda Klein.  Please ask patient if he would like to start a statin medication to help with cholesterol?

## 2018-02-27 ENCOUNTER — Other Ambulatory Visit: Payer: Self-pay | Admitting: Podiatry

## 2018-02-27 MED ORDER — ONDANSETRON HCL 4 MG PO TABS: 4.0000 mg | ORAL_TABLET | Freq: Three times a day (TID) | ORAL | 0 refills | Status: DC | PRN

## 2018-02-27 MED ORDER — CEPHALEXIN 500 MG PO CAPS: 500.0000 mg | ORAL_CAPSULE | Freq: Three times a day (TID) | ORAL | 0 refills | Status: DC

## 2018-02-27 MED ORDER — OXYCODONE-ACETAMINOPHEN 10-325 MG PO TABS: 1.0000 | ORAL_TABLET | Freq: Four times a day (QID) | ORAL | 0 refills | Status: AC | PRN

## 2018-02-28 ENCOUNTER — Encounter: Payer: Self-pay | Admitting: Podiatry

## 2018-02-28 DIAGNOSIS — M2021 Hallux rigidus, right foot: Secondary | ICD-10-CM

## 2018-03-06 ENCOUNTER — Encounter: Payer: Managed Care, Other (non HMO) | Admitting: Family Medicine

## 2018-03-07 ENCOUNTER — Ambulatory Visit (INDEPENDENT_AMBULATORY_CARE_PROVIDER_SITE_OTHER): Payer: Self-pay | Admitting: Podiatry

## 2018-03-07 ENCOUNTER — Ambulatory Visit (INDEPENDENT_AMBULATORY_CARE_PROVIDER_SITE_OTHER): Payer: Managed Care, Other (non HMO)

## 2018-03-07 VITALS — BP 136/93 | HR 76 | Temp 97.8°F

## 2018-03-07 DIAGNOSIS — M205X1 Other deformities of toe(s) (acquired), right foot: Secondary | ICD-10-CM

## 2018-03-07 DIAGNOSIS — Z9889 Other specified postprocedural states: Secondary | ICD-10-CM

## 2018-03-11 NOTE — Progress Notes (Signed)
   Subjective:  Patient presents today status post Troy Hogan bunion implant right. DOS: 02/28/2018. He states he is doing well. He has been taking Tylenol and states the pain has been tolerable. There are no modifying factors noted. Patient is here for further evaluation and treatment.    Past Medical History:  Diagnosis Date  . Arthritis   . Back pain   . CKD (chronic kidney disease) stage 2, GFR 60-89 ml/min 02/11/2018  . Plantar fasciitis       Objective/Physical Exam Neurovascular status intact.  Skin incisions appear to be well coapted with sutures and staples intact. No sign of infectious process noted. No dehiscence. No active bleeding noted. Moderate edema noted to the surgical extremity.  Radiographic Exam:  Orthopedic hardware and osteotomies sites appear to be stable with routine healing.  Assessment: 1. s/p Keller bunion implant right. DOS: 02/28/2018   Plan of Care:  1. Patient was evaluated. X-rays reviewed 2. Dressing changed. Keep clean, dry and intact for one week.  3. Continue weightbearing in CAM boot.  4. Return to clinic in one week.    Edrick Kins, DPM [facility]  Dr. Edrick Kins, [place]                                        [place],  [address]                Office [phone]  Fax [phone]

## 2018-03-14 ENCOUNTER — Encounter: Payer: Managed Care, Other (non HMO) | Admitting: Podiatry

## 2018-03-19 ENCOUNTER — Ambulatory Visit (INDEPENDENT_AMBULATORY_CARE_PROVIDER_SITE_OTHER): Payer: Managed Care, Other (non HMO) | Admitting: Podiatry

## 2018-03-19 ENCOUNTER — Encounter: Payer: Self-pay | Admitting: Podiatry

## 2018-03-19 ENCOUNTER — Other Ambulatory Visit: Payer: Self-pay

## 2018-03-19 DIAGNOSIS — Z9889 Other specified postprocedural states: Secondary | ICD-10-CM

## 2018-03-19 DIAGNOSIS — M205X1 Other deformities of toe(s) (acquired), right foot: Secondary | ICD-10-CM

## 2018-03-19 NOTE — Progress Notes (Signed)
Presents today date of surgery February 28, 2018 status post Jake Michaelis bunion implant right states that is healing fast I think I walk around without the boot all the time.  He denies getting it wet denies any trauma.  Objective: Vital signs are stable he is alert and oriented x3.  Pulses are palpable.  Dry sterile dressing intact was removed demonstrates no erythematous mild edema no cellulitis drainage odor good range of motion of the toe mildly tender on initial range of motion.  Assessment: Well-healing surgical foot.  Plan: Sutures were removed placed him in a compression anklet and a Darco shoe we will follow-up with him in a couple weeks at which time we hope to get him into a regular pair of tennis shoes.  He will need a set of x-rays at that time

## 2018-03-21 ENCOUNTER — Encounter: Payer: Managed Care, Other (non HMO) | Admitting: Family Medicine

## 2018-03-26 ENCOUNTER — Other Ambulatory Visit: Payer: Managed Care, Other (non HMO)

## 2018-04-09 ENCOUNTER — Other Ambulatory Visit: Payer: Managed Care, Other (non HMO)

## 2018-10-27 ENCOUNTER — Ambulatory Visit: Payer: Self-pay

## 2019-04-21 NOTE — Progress Notes (Signed)
Patient ID: Troy Hogan, male    DOB: [birth year], 59 y.o.   MRN: [id number]  PCP: Towanda Malkin, MD  Chief Complaint  Patient presents with  . Follow-up  . Hyperlipidemia  . Erectile Dysfunction  . Chronic Kidney Disease    wants to follow up on CKD mentioned by DR. Lada   . Gout    Subjective:   Troy Hogan is a 59 y.o. male, presents to clinic with CC of the following:  Chief Complaint  Patient presents with  . Follow-up  . Hyperlipidemia  . Erectile Dysfunction  . Chronic Kidney Disease    wants to follow up on CKD mentioned by DR. Lada   . Gout    HPI:  Patient is a 59 y.o.male who established care with [facility] 02/07/2018 (saw Dr. Sanda Klein). This is my fist visit with the patient. 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 He also notes that his feet have been more problematic intermittently, was his left briefly, then moved to his right, and sometimes do feel a little bit warm when asked.  He was concerned he may be having gout type symptoms.  Not specific to his big toe, more involving his entire foot and sometimes up to his ankle.  Not problematic today.  He denied a history of gout in his past.  Was diagnosed 02/07/2018 with HTN BP Readings from Last 3 Encounters:  04/23/19 132/82  03/07/18 (!) 136/93  02/07/18 138/80   Medication regimen - no BP meds presently, he did take the blood pressure medicines briefly after the last visit, then just stopped on his own. Does not check BP's at home + FH - both brothers on BP med Denies CP, palp's, SOB, LE swelling, increased HA's  His blood pressure was elevated on a recent eye exam 2 to 3 weeks ago, although has not had blood pressure checks otherwise   Hyperlipidemia also  diagnosed Lab Results  Component Value Date   CHOL 242 (H) 02/07/2018   HDL 64 02/07/2018   LDLCALC 158 (H) 02/07/2018   TRIG 94 02/07/2018   CHOLHDL 3.8 02/07/2018   Not on a statin, never started the one recommended previously by Dr. Sanda Klein. Really has not watched his diet since that last visit, although noted he really wants to start taking better care of himself and has tried to do a better job watching his diet in the past month.  CKD - mild  Noted previously on lab check, he had some questions about this and educated him about this today.  Erectile dysfunction; thinks it is age; has morning erections; partial erections; loses some erection at times. Prescribed sildenafil -50mg  as needed in past and did take and was helpful. He has not used in the more recent past, although would be interested in having again as he noted he did have success with using this medicine previously.  Prostate CA screening Lab Results  Component Value Date   PSA1 1.2 05/23/2016   PSA 1.2 02/07/2018  Up at most twice to urinate, some hesitancy noted, no urgency, no blood in urine  DDD - disc herniation concern Had MRI in past (about 8+ years ago), low back pain episodes intermittently in the past Was  hospitalized for back about 5 years ago Saw Dr. Andree Elk in the past, had injections and improved, no recent problems with his back noted.  Hallux rigidus first metatarsophalangeal joint right with pain. Had surgery 02/2018 by Dr. Milinda Pointer and no complications.  Vision decrease/ loss in past seeing optometrists, just saw 3 weeks ago and BP a concern on that visit.  Wears glasses Referred back on 4/28 to [facility], concern for possible glaucoma noted by the patient.  Colon CA screening Referral to GI noted at first visit, did not follow-up and has not had colonoscopy.  He noted he is anxious to proceed with that presently. 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  Patient Active Problem List   Diagnosis Date Noted  . Mixed hyperlipidemia 04/23/2019  . Degenerative disc disease, lumbar 04/23/2019  . Erectile dysfunction 02/17/2018  . Essential hypertension, benign 02/17/2018  . CKD (chronic kidney disease) stage 2, GFR 60-89 ml/min 02/11/2018  . Preventative health care 02/07/2018      Current Outpatient Medications:  .  aspirin EC 81 MG tablet, Take 1 tablet (81 mg total) by mouth daily., Disp: , Rfl:    Allergies  Allergen Reactions  . Codeine Nausea Only     Family History  Problem Relation Age of Onset  . Diabetes Mother   . Hypertension Mother   . Cirrhosis Father   . Alcohol abuse Father   . Hypertension Sister   . Hypertension Brother   . Stroke Paternal Grandmother   . Hypertension Brother      Social History   Tobacco Use  . Smoking status: Former Smoker    Types: Cigars  . Smokeless tobacco: Former Systems developer    Types: Snuff    Quit date: 12/27/2017  . Tobacco comment: 1 week ago  Substance Use Topics  . Alcohol use: Yes    Alcohol/week: 3.0 standard drinks    Types: 3 Cans of beer per week    Comment: 3 beers every other day    With staff assistance, above reviewed with the patient today.  ROS: As per HPI, otherwise no specific complaints on a limited and focused system review   No results found for this or any  previous visit (from the past 72 hour(s)).   PHQ2/9: Depression screen [facility] 2/9 04/23/2019 02/07/2018  Decreased Interest 0 0  Down, Depressed, Hopeless 0 0  PHQ - 2 Score 0 0  Altered sleeping 1 0  Tired, decreased energy 1 0  Change in appetite 0 0  Feeling bad or failure about yourself  0 0  Trouble concentrating 0 0  Moving slowly or fidgety/restless 0 0  Suicidal thoughts 0 0  PHQ-9 Score 2 0  Difficult doing work/chores Not difficult at all Not difficult at all   PHQ-2/9 Result reviewed  Fall Risk: Fall Risk  04/23/2019  02/07/2018  Falls in the past year? 0 0  Number falls in past yr: 0 0  Injury with Fall? 0 0      Objective:   Vitals:   04/23/19 0832  BP: 132/82  Pulse: 98  Resp: 14  Temp: 97.9 F (36.6 C)  TempSrc: Temporal  SpO2: 98%  Weight: 154 lb 3.2 oz (69.9 kg)  Height: 5\' 7"  (1.702 m)    Body mass index is 24.15 kg/m.  Physical Exam   NAD, masked, pleasant HEENT - [place]/AT, sclera anicteric, PERRL, EOMI, conj - non-inj'ed, TM's and canals clear, pharynx clear Neck - supple, no adenopathy, no TM, carotids 2+ and = without bruits bilat Car - RRR without m/g/r Pulm- RR and effort normal at rest, CTA without wheeze or rales Abd - soft, NT, ND, BS+,  no masses, no HSM Back - no CVA tenderness GU/rectal - Prostate - no tenderness, fluctuance, nodules on palpation, not increased in size No external hemmorhoids Skin- no rash noted on exposed areas,  Ext - no LE edema, no active joints noted when assessing the hands, with no swelling and good grip strength.  Good wrist joint motion and strength, ankles had good range of motion with no swelling and good strength, Neuro/psychiatric - affect was not flat, appropriate with conversation  Alert and oriented  Grossly non-focal - good strength on testing extremities, sensation intact to LT in distal extremities, Romberg negative, DTRs 2+ and equal in the patella, no pronator drift, good finger-to-nose, good tandem  walk  Speech and gait are normal   Results for orders placed or performed in visit on 02/07/18  CBC  Result Value Ref Range   WBC 7.0 3.8 - 10.8 Thousand/uL   RBC 4.20 4.20 - 5.80 Million/uL   Hemoglobin 13.6 13.2 - 17.1 g/dL   HCT 40.4 38.5 - 50.0 %   MCV 96.2 80.0 - 100.0 fL   MCH 32.4 27.0 - 33.0 pg   MCHC 33.7 32.0 - 36.0 g/dL   RDW 13.6 11.0 - 15.0 %   Platelets 262 140 - 400 Thousand/uL   MPV 10.3 7.5 - 12.5 fL  COMPLETE METABOLIC PANEL WITH GFR  Result Value Ref Range   Glucose, Bld 92 65 - 99 mg/dL   BUN 18 7 - 25 mg/dL   Creat 1.13 0.70 - 1.33 mg/dL   GFR, Est Non African American 73 > OR = 60 mL/min/1.42m2   GFR, Est African American 84 > OR = 60 mL/min/1.15m2   BUN/Creatinine Ratio NOT APPLICABLE 6 - 22 (calc)   Sodium 138 135 - 146 mmol/L   Potassium 4.4 3.5 - 5.3 mmol/L   Chloride 103 98 - 110 mmol/L   CO2 27 20 - 32 mmol/L   Calcium 9.8 8.6 - 10.3 mg/dL   Total Protein 7.0 6.1 - 8.1 g/dL   Albumin 4.8 3.6 - 5.1 g/dL   Globulin 2.2 1.9 - 3.7 g/dL (calc)   AG Ratio 2.2 1.0 - 2.5 (calc)   Total Bilirubin 0.4 0.2 - 1.2 mg/dL   Alkaline phosphatase (APISO) 46 40 - 115 U/L   AST 20 10 - 35 U/L   ALT 21 9 - 46 U/L  Lipid panel  Result Value Ref Range   Cholesterol 242 (H) <200 mg/dL   HDL 64 >40 mg/dL   Triglycerides 94 <150 mg/dL   LDL Cholesterol (Calc) 158 (H) mg/dL (calc)   Total CHOL/HDL Ratio 3.8 <5.0 (calc)   Non-HDL Cholesterol (Calc) 178 (H) <130  mg/dL (calc)  PSA  Result Value Ref Range   PSA 1.2 < OR = 4.0 ng/mL       Assessment & Plan:  1. Essential hypertension, benign Blood pressure was okay on check today, although concerned with the recent blood pressure being elevated with a visit with his eye doctor 2 to 3 weeks ago.  He does not check his blood pressures at home.  His blood pressure on a visit a while back here was higher prompting a medication to be started.  He has not continued that at home and has not been taking her blood pressure  medicine in the recent past. We will check some lab test presently Discussed potentially trying to check his blood pressures at home in the future, and he may try to get a cuff to do so. He has a follow-up with an eye doctor again later this month, and will have a blood pressure check then, and agreed to have a sooner follow-up in about 4 weeks here to recheck. Did not restart the blood pressure medicine today. - COMPLETE METABOLIC PANEL WITH GFR - CBC with Differential/Platelet  2. CKD (chronic kidney disease) stage 2, GFR 60-89 ml/min Educated him on this, continue to monitor. We will recheck labs today - COMPLETE METABOLIC PANEL WITH GFR - CBC with Differential/Platelet - Urinalysis, Complete  3. Mixed hyperlipidemia Did feel the statin product was a good recommendation previously and will recheck his labs today.  Anticipate they will not be improved, and the statin product again will be recommended.  He seemed agreeable to starting 1 again and await those labs presently.  Discussed the risk benefits of this medicine, and do feel it will be very beneficial for him to be on this. Included information in the AVS on diet recommendations. - COMPLETE METABOLIC PANEL WITH GFR - Lipid panel - TSH  4. Erectile dysfunction, unspecified erectile dysfunction type He did not want to return to the sildenafil product, as it was helpful for him in the past.  I did refill that today, with a 20 mg dose to start, and if that is not helpful, can take up to 20 mg tab prior to planned activities and assess.  Did note can increase up to 3 tablets over time as needed, although the goal is to use as little as needed to have the benefit.  (The Viagra product he was taking previously was a 50 mg dose) Await his response. - Urinalysis, Complete  5. Screening for colorectal cancer He did agree to have a colonoscopy for screening, and a referral was again provided. - Ambulatory referral to Gastroenterology  6.  Screening for prostate cancer Discussed the role of PSA and screening, and mutually agreed to continue utilizing the PSA to help with this.  The exam today was unremarkable. - PSA  7. Degenerative disc disease, lumbar In the recent past, he has been doing very well without concerning back pains recurring.  Continue to monitor.  8. Arthritis/? Gout sx's Some concern was noted for arthritic type symptoms in his hands, with no active joints noted on exam today.  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  The symptoms in his feet raise concerns for possible gout episodes, more by his history.  Not a classic gout flare with the big toe involvement.  Not symptomatic today. Did feel checking a uric acid level was indicated, will hold on more rheumatoid screening test as feel that is less likely.  We will check a CBC and TSH as well. Await these results, and continue to follow clinically. - COMPLETE METABOLIC PANEL WITH GFR - CBC with Differential/Platelet - Uric acid - TSH    .      Towanda Malkin, MD 04/23/19 8:46 AM

## 2019-04-23 ENCOUNTER — Encounter: Payer: Self-pay | Admitting: Internal Medicine

## 2019-04-23 ENCOUNTER — Other Ambulatory Visit: Payer: Self-pay

## 2019-04-23 ENCOUNTER — Ambulatory Visit (INDEPENDENT_AMBULATORY_CARE_PROVIDER_SITE_OTHER): Payer: Managed Care, Other (non HMO) | Admitting: Internal Medicine

## 2019-04-23 VITALS — BP 132/82 | HR 98 | Temp 97.9°F | Resp 14 | Ht 67.0 in | Wt 154.2 lb

## 2019-04-23 DIAGNOSIS — N529 Male erectile dysfunction, unspecified: Secondary | ICD-10-CM | POA: Diagnosis not present

## 2019-04-23 DIAGNOSIS — Z1211 Encounter for screening for malignant neoplasm of colon: Secondary | ICD-10-CM

## 2019-04-23 DIAGNOSIS — M1990 Unspecified osteoarthritis, unspecified site: Secondary | ICD-10-CM

## 2019-04-23 DIAGNOSIS — I10 Essential (primary) hypertension: Secondary | ICD-10-CM | POA: Diagnosis not present

## 2019-04-23 DIAGNOSIS — N182 Chronic kidney disease, stage 2 (mild): Secondary | ICD-10-CM | POA: Diagnosis not present

## 2019-04-23 DIAGNOSIS — E782 Mixed hyperlipidemia: Secondary | ICD-10-CM | POA: Diagnosis not present

## 2019-04-23 DIAGNOSIS — M5136 Other intervertebral disc degeneration, lumbar region: Secondary | ICD-10-CM | POA: Insufficient documentation

## 2019-04-23 DIAGNOSIS — Z125 Encounter for screening for malignant neoplasm of prostate: Secondary | ICD-10-CM

## 2019-04-23 DIAGNOSIS — Z1212 Encounter for screening for malignant neoplasm of rectum: Secondary | ICD-10-CM

## 2019-04-23 DIAGNOSIS — M51369 Other intervertebral disc degeneration, lumbar region without mention of lumbar back pain or lower extremity pain: Secondary | ICD-10-CM | POA: Insufficient documentation

## 2019-04-23 MED ORDER — SILDENAFIL CITRATE 20 MG PO TABS: ORAL_TABLET | ORAL | 5 refills | Status: DC

## 2019-04-23 NOTE — Patient Instructions (Signed)

## 2019-04-24 ENCOUNTER — Telehealth: Payer: Self-pay

## 2019-04-24 LAB — COMPLETE METABOLIC PANEL WITH GFR
AG Ratio: 1.8 (calc) (ref 1.0–2.5)
ALT: 20 U/L (ref 9–46)
AST: 22 U/L (ref 10–35)
Albumin: 4.7 g/dL (ref 3.6–5.1)
Alkaline phosphatase (APISO): 50 U/L (ref 35–144)
BUN: 17 mg/dL (ref 7–25)
CO2: 28 mmol/L (ref 20–32)
Calcium: 9.6 mg/dL (ref 8.6–10.3)
Chloride: 99 mmol/L (ref 98–110)
Creat: 0.88 mg/dL (ref 0.70–1.33)
GFR, Est African American: 111 mL/min/{1.73_m2} (ref >=60)
GFR, Est Non African American: 96 mL/min/{1.73_m2} (ref >=60)
Globulin: 2.6 g/dL (calc) (ref 1.9–3.7)
Glucose, Bld: 113 mg/dL — ABNORMAL HIGH (ref 65–99)
Potassium: 4.3 mmol/L (ref 3.5–5.3)
Sodium: 136 mmol/L (ref 135–146)
Total Bilirubin: 0.7 mg/dL (ref 0.2–1.2)
Total Protein: 7.3 g/dL (ref 6.1–8.1)

## 2019-04-24 LAB — LIPID PANEL
Cholesterol: 241 mg/dL — ABNORMAL HIGH (ref <200)
HDL: 71 mg/dL (ref >=40)
LDL Cholesterol (Calc): 149 mg/dL (calc) — ABNORMAL HIGH
Non-HDL Cholesterol (Calc): 170 mg/dL (calc) — ABNORMAL HIGH (ref <130)
Total CHOL/HDL Ratio: 3.4 (calc) (ref <5.0)
Triglycerides: 98 mg/dL (ref <150)

## 2019-04-24 LAB — URINALYSIS, COMPLETE
Bacteria, UA: NONE SEEN /HPF
Bilirubin Urine: NEGATIVE
Glucose, UA: NEGATIVE
Hgb urine dipstick: NEGATIVE
Hyaline Cast: NONE SEEN /LPF
Ketones, ur: NEGATIVE
Leukocytes,Ua: NEGATIVE
Nitrite: NEGATIVE
Protein, ur: NEGATIVE
Specific Gravity, Urine: 1.025 (ref 1.001–1.03)
Squamous Epithelial / HPF: NONE SEEN /HPF (ref <=5)
WBC, UA: NONE SEEN /HPF (ref 0–5)
pH: <=5 (ref 5.0–8.0)

## 2019-04-24 LAB — CBC WITH DIFFERENTIAL/PLATELET
Absolute Monocytes: 666 cells/uL (ref 200–950)
Basophils Absolute: 28 cells/uL (ref 0–200)
Basophils Relative: 0.5 %
Eosinophils Absolute: 72 cells/uL (ref 15–500)
Eosinophils Relative: 1.3 %
HCT: 41.9 % (ref 38.5–50.0)
Hemoglobin: 14.1 g/dL (ref 13.2–17.1)
Lymphs Abs: 1859 cells/uL (ref 850–3900)
MCH: 31.8 pg (ref 27.0–33.0)
MCHC: 33.7 g/dL (ref 32.0–36.0)
MCV: 94.4 fL (ref 80.0–100.0)
MPV: 10.2 fL (ref 7.5–12.5)
Monocytes Relative: 12.1 %
Neutro Abs: 2877 cells/uL (ref 1500–7800)
Neutrophils Relative %: 52.3 %
Platelets: 216 10*3/uL (ref 140–400)
RBC: 4.44 10*6/uL (ref 4.20–5.80)
RDW: 13.4 % (ref 11.0–15.0)
Total Lymphocyte: 33.8 %
WBC: 5.5 10*3/uL (ref 3.8–10.8)

## 2019-04-24 LAB — TSH: TSH: 2.92 mIU/L (ref 0.40–4.50)

## 2019-04-24 LAB — URIC ACID: Uric Acid, Serum: 5.3 mg/dL (ref 4.0–8.0)

## 2019-04-24 LAB — PSA: PSA: 2.8 ng/mL (ref <=4.0)

## 2019-04-24 NOTE — Telephone Encounter (Signed)
Patient informed of labs by phone. He will start taking atorvastatin 10 mg-1 daily, Patient verbalized understanding and has no questions. Patient will call if he needs prescription filled of atorvastatin.   Copied from [place] [phone]. Topic: Quick Communication - See Telephone Encounter >> Apr 24, 2019 10:14 AM Blase Mess A wrote: CRM for notification. See Telephone encounter for: 04/24/19.  Patient is calling back for his lab results.

## 2019-04-29 ENCOUNTER — Other Ambulatory Visit: Payer: Self-pay

## 2019-04-29 ENCOUNTER — Telehealth (INDEPENDENT_AMBULATORY_CARE_PROVIDER_SITE_OTHER): Payer: Self-pay | Admitting: Gastroenterology

## 2019-04-29 DIAGNOSIS — Z1211 Encounter for screening for malignant neoplasm of colon: Secondary | ICD-10-CM

## 2019-04-29 NOTE — Progress Notes (Signed)
Gastroenterology Pre-Procedure Review  Request Date: 05/25/19 Requesting Physician: Dr. Marius Ditch  PATIENT REVIEW QUESTIONS: The patient responded to the following health history questions as indicated:    1. Are you having any GI issues? no 2. Do you have a personal history of Polyps? no 3. Do you have a family history of Colon Cancer or Polyps? no 4. Diabetes Mellitus? no 5. Joint replacements in the past 12 months?no 6. Major health problems in the past 3 months?no 7. Any artificial heart valves, MVP, or defibrillator?no    MEDICATIONS & ALLERGIES:    Patient reports the following regarding taking any anticoagulation/antiplatelet therapy:   Plavix, Coumadin, Eliquis, Xarelto, Lovenox, Pradaxa, Brilinta, or Effient? no Aspirin? no  Patient confirms/reports the following medications:  Current Outpatient Medications  Medication Sig Dispense Refill  . aspirin EC 81 MG tablet Take 1 tablet (81 mg total) by mouth daily.    Marland Kitchen losartan (COZAAR) 25 MG tablet Take 25 mg by mouth daily.    . sildenafil (REVATIO) 20 MG tablet Take 1-3 tabs 0.5-2 hours prior to planned sexual activity 30 tablet 5   No current facility-administered medications for this visit.    Patient confirms/reports the following allergies:  Allergies  Allergen Reactions  . Codeine Nausea Only    Orders Placed This Encounter  Procedures  . Procedural/ Surgical Case Request: COLONOSCOPY WITH PROPOFOL    Standing Status:   Standing    Number of Occurrences:   1    Order Specific Question:   Pre-op diagnosis    Answer:   screening colonoscopy    Order Specific Question:   CPT Code    Answer:   [id number]    AUTHORIZATION INFORMATION Primary Insurance: 1D#: Group #:  Secondary Insurance: 1D#: Group #:  SCHEDULE INFORMATION: Date: 05/25/19 Time: Location:[facility]

## 2019-05-21 ENCOUNTER — Other Ambulatory Visit: Payer: Self-pay

## 2019-05-21 ENCOUNTER — Other Ambulatory Visit
Admission: RE | Admit: 2019-05-21 | Discharge: 2019-05-21 | Disposition: A | Discharge status: Home / Self Care | Payer: Managed Care, Other (non HMO) | Source: Ambulatory Visit | Attending: Gastroenterology | Admitting: Gastroenterology

## 2019-05-21 DIAGNOSIS — Z01812 Encounter for preprocedural laboratory examination: Secondary | ICD-10-CM | POA: Insufficient documentation

## 2019-05-21 DIAGNOSIS — Z20822 Contact with and (suspected) exposure to covid-19: Secondary | ICD-10-CM | POA: Diagnosis not present

## 2019-05-21 LAB — SARS CORONAVIRUS 2 (TAT 6-24 HRS): SARS Coronavirus 2: NEGATIVE

## 2019-05-21 NOTE — Progress Notes (Deleted)
Patient is a 59 year old male whom I first met 04/23/2019 on a follow-up visit, with that note reviewed He returns today for follow-up  HTN -first diagnosed in January 2020 BP Readings from Last 3 Encounters:  04/23/19 132/82  03/07/18 (!) 136/93  02/07/18 138/80   Medication regimen - no BP meds presently, he did take the blood pressure medicines briefly after the last visit, then just stopped on his own. Does not check BP's at home, and after last visit, he was considering getting a blood pressure cuff to check his blood pressures at home + FH - both brothers on BP med Denies CP, palp's, SOB, LE swelling, increased HA's    Hyperlipidemia also diagnosed Lab Results  Component Value Date   CHOL 241 (H) 04/23/2019   HDL 71 04/23/2019   LDLCALC 149 (H) 04/23/2019   TRIG 98 04/23/2019   CHOLHDL 3.4 04/23/2019    Not on a statin, never started the one recommended previously by Dr. Sanda Klein, and after his most recent lipid check, the statin was recommended to be started, 10 mg of Lipitor. has tried to do a better job watching his diet in the recent past  CKD - mild  Noted  On prior lab checks, with most recent lab check after last visit showing no concerns presently Lab Results  Component Value Date   CREATININE 0.88 04/23/2019   BUN 17 04/23/2019   NA 136 04/23/2019   K 4.3 04/23/2019   CL 99 04/23/2019   CO2 28 04/23/2019    Erectile dysfunction; thinks it is age; has morning erections; partial erections; loses some erection at times. Prescribed sildenafil -74m as needed in past and did take and was helpful, and this was prescribed again for him last visit.  Prostate CA screening Lab Results  Component Value Date   PSA1 1.2 05/23/2016   PSA 2.8 04/23/2019   PSA 1.2 02/07/2018   The slight increase was noted on his most recent check, with an exam done at the prior visit as well in April. We will continue to monitor, and check in 1 year rather than waiting 2 to 3 years  felt best with this recent increase noted.  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 AAndree Elkin the past, had injections and improved, no recent problems with his back noted.  Vision decrease/ loss in past [facility] noted by the patient and continues to follow with them presently.  Colon CA screening Referral to GI provided last visit, and he has a colonoscopy scheduled for next week, 05/25/2019 No black stools, no BRBPR, no change in bowel habits  He noted last visit that he has been having more arthritis type symptoms in his hands, gets some achiness and discomfort at times, often after having done a lot of activities with his hands which he does daily with his home repair work, denies any specific joints getting red or swollen. He also notes that his feet have been more problematic intermittently, was his left briefly, then moved to his right, and sometimes do feel a little bit warm when asked.  He was concerned he may be having gout type symptoms.  Not specific to his big toe, more involving his entire foot and sometimes up to his ankle. He denied a history of gout in his  past.  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

## 2019-05-22 ENCOUNTER — Ambulatory Visit: Payer: Managed Care, Other (non HMO) | Admitting: Internal Medicine

## 2019-05-25 ENCOUNTER — Ambulatory Visit: Payer: Managed Care, Other (non HMO) | Admitting: Anesthesiology

## 2019-05-25 ENCOUNTER — Encounter
Admission: RE | Disposition: A | Discharge status: Home / Self Care | Payer: Self-pay | Source: Home / Self Care | Attending: Gastroenterology

## 2019-05-25 ENCOUNTER — Ambulatory Visit
Admission: RE | Admit: 2019-05-25 | Discharge: 2019-05-25 | Disposition: A | Discharge status: Home / Self Care | Payer: Managed Care, Other (non HMO) | Attending: Gastroenterology | Admitting: Gastroenterology

## 2019-05-25 ENCOUNTER — Other Ambulatory Visit: Payer: Self-pay

## 2019-05-25 DIAGNOSIS — Z885 Allergy status to narcotic agent status: Secondary | ICD-10-CM | POA: Insufficient documentation

## 2019-05-25 DIAGNOSIS — D122 Benign neoplasm of ascending colon: Secondary | ICD-10-CM | POA: Insufficient documentation

## 2019-05-25 DIAGNOSIS — Z1211 Encounter for screening for malignant neoplasm of colon: Secondary | ICD-10-CM | POA: Diagnosis present

## 2019-05-25 DIAGNOSIS — Z79899 Other long term (current) drug therapy: Secondary | ICD-10-CM | POA: Diagnosis not present

## 2019-05-25 DIAGNOSIS — D125 Benign neoplasm of sigmoid colon: Secondary | ICD-10-CM | POA: Diagnosis not present

## 2019-05-25 DIAGNOSIS — Z87891 Personal history of nicotine dependence: Secondary | ICD-10-CM | POA: Insufficient documentation

## 2019-05-25 DIAGNOSIS — Z7982 Long term (current) use of aspirin: Secondary | ICD-10-CM | POA: Insufficient documentation

## 2019-05-25 DIAGNOSIS — K635 Polyp of colon: Secondary | ICD-10-CM

## 2019-05-25 DIAGNOSIS — D123 Benign neoplasm of transverse colon: Secondary | ICD-10-CM | POA: Diagnosis not present

## 2019-05-25 DIAGNOSIS — M1990 Unspecified osteoarthritis, unspecified site: Secondary | ICD-10-CM | POA: Diagnosis not present

## 2019-05-25 DIAGNOSIS — Z8249 Family history of ischemic heart disease and other diseases of the circulatory system: Secondary | ICD-10-CM | POA: Insufficient documentation

## 2019-05-25 DIAGNOSIS — N182 Chronic kidney disease, stage 2 (mild): Secondary | ICD-10-CM | POA: Insufficient documentation

## 2019-05-25 HISTORY — PX: COLONOSCOPY WITH PROPOFOL: SHX5780

## 2019-05-25 SURGERY — COLONOSCOPY WITH PROPOFOL
Anesthesia: General

## 2019-05-25 MED ORDER — MIDAZOLAM HCL 2 MG/2ML IJ SOLN
INTRAMUSCULAR | Status: AC
  Filled 2019-05-25: qty 2

## 2019-05-25 MED ORDER — LIDOCAINE HCL (PF) 2 % IJ SOLN
INTRAMUSCULAR | Status: AC
  Filled 2019-05-25: qty 5

## 2019-05-25 MED ORDER — PROPOFOL 10 MG/ML IV BOLUS
INTRAVENOUS | Status: DC | PRN
  Administered 2019-05-25: 100 mg via INTRAVENOUS

## 2019-05-25 MED ORDER — MIDAZOLAM HCL 2 MG/2ML IJ SOLN
INTRAMUSCULAR | Status: DC | PRN
  Administered 2019-05-25: 2 mg via INTRAVENOUS

## 2019-05-25 MED ORDER — PROPOFOL 500 MG/50ML IV EMUL
INTRAVENOUS | Status: DC | PRN
  Administered 2019-05-25: 180 ug/kg/min via INTRAVENOUS

## 2019-05-25 MED ORDER — SODIUM CHLORIDE 0.9 % IV SOLN
INTRAVENOUS | Status: DC
  Administered 2019-05-25: 1000 mL via INTRAVENOUS

## 2019-05-25 MED ORDER — PROPOFOL 500 MG/50ML IV EMUL
INTRAVENOUS | Status: AC
  Filled 2019-05-25: qty 50

## 2019-05-25 NOTE — Anesthesia Postprocedure Evaluation (Signed)
Anesthesia Post Note  Patient: Troy Hogan  Procedure(s) Performed: COLONOSCOPY WITH PROPOFOL (N/A )  Patient location during evaluation: Endoscopy Anesthesia Type: General Level of consciousness: awake and alert Pain management: pain level controlled Vital Signs Assessment: post-procedure vital signs reviewed and stable Respiratory status: spontaneous breathing, nonlabored ventilation, respiratory function stable and patient connected to nasal cannula oxygen Cardiovascular status: blood pressure returned to baseline and stable Postop Assessment: no apparent nausea or vomiting Anesthetic complications: no     Last Vitals:  Vitals:   05/25/19 1247 05/25/19 1257  BP: 128/89 134/89  Pulse: 65 64  Resp: 10 17  Temp:    SpO2: 100% 100%    Last Pain:  Vitals:   05/25/19 1257  TempSrc:   PainSc: 0-No pain                 Arita Miss

## 2019-05-25 NOTE — Op Note (Signed)
[facility] Gastroenterology Patient Name: Troy Hogan Procedure Date: 05/25/2019 11:59 AM MRN: [id number] Account #: [id number] Date of Birth: June 28, 1960 Admit Type: Outpatient Age: 59 Room: [facility] ENDO ROOM 1 Gender: Male Note Status: Finalized Procedure:             Colonoscopy Indications:           Screening for colorectal malignant neoplasm, This is                         the patient's first colonoscopy Providers:             Lin Landsman MD, MD Medicines:             Monitored Anesthesia Care Complications:         No immediate complications. Estimated blood loss: None. Procedure:             Pre-Anesthesia Assessment:                        - Prior to the procedure, a History and Physical was                         performed, and patient medications and allergies were                         reviewed. The patient is competent. The risks and                         benefits of the procedure and the sedation options and                         risks were discussed with the patient. All questions                         were answered and informed consent was obtained.                         Patient identification and proposed procedure were                         verified by the physician, the nurse, the                         anesthesiologist, the anesthetist and the technician                         in the pre-procedure area in the procedure room in the                         endoscopy suite. Mental Status Examination: alert and                         oriented. Airway Examination: normal oropharyngeal                         airway and neck mobility. Respiratory Examination:                         clear to auscultation. CV Examination: normal.  Prophylactic Antibiotics: The patient does not require                         prophylactic antibiotics. Prior Anticoagulants: The                         patient has taken no previous  anticoagulant or                         antiplatelet agents. ASA Grade Assessment: II - A                         patient with mild systemic disease. After reviewing                         the risks and benefits, the patient was deemed in                         satisfactory condition to undergo the procedure. The                         anesthesia plan was to use monitored anesthesia care                         (MAC). Immediately prior to administration of                         medications, the patient was re-assessed for adequacy                         to receive sedatives. The heart rate, respiratory                         rate, oxygen saturations, blood pressure, adequacy of                         pulmonary ventilation, and response to care were                         monitored throughout the procedure. The physical                         status of the patient was re-assessed after the                         procedure.                        After obtaining informed consent, the colonoscope was                         passed under direct vision. Throughout the procedure,                         the patient's blood pressure, pulse, and oxygen                         saturations were monitored continuously. The  Colonoscope was introduced through the anus and                         advanced to the the cecum, identified by appendiceal                         orifice and ileocecal valve. The colonoscopy was                         performed without difficulty. The patient tolerated                         the procedure well. The quality of the bowel                         preparation was evaluated using the BBPS [facility] Bowel                         Preparation Scale) with scores of: Right Colon = 3,                         Transverse Colon = 3 and Left Colon = 3 (entire mucosa                         seen well with no residual staining, small fragments                          of stool or opaque liquid). The total BBPS score                         equals 9. Findings:      The perianal and digital rectal examinations were normal. Pertinent       negatives include normal sphincter tone and no palpable rectal lesions.      A 4 mm polyp was found in the ascending colon. The polyp was sessile.       The polyp was removed with a cold snare. Polyp resection was incomplete,       and the resected tissue was not retrieved.      Two sessile polyps were found in the sigmoid colon and transverse colon.       The polyps were 6 to 7 mm in size. These polyps were removed with a cold       snare. Resection and retrieval were complete.      The retroflexed view of the distal rectum and anal verge was normal and       showed no anal or rectal abnormalities. Impression:            - One 4 mm polyp in the ascending colon, removed with                         a cold snare. Incomplete resection. Resected tissue                         not retrieved.                        - Two 6 to 7 mm polyps in the sigmoid colon  and in the                         transverse colon, removed with a cold snare. Resected                         and retrieved.                        - The distal rectum and anal verge are normal on                         retroflexion view. Recommendation:        - Discharge patient to home (with escort).                        - Resume previous diet today.                        - Continue present medications.                        - Await pathology results.                        - Repeat colonoscopy in 5 years for surveillance. Procedure Code(s):     --- Professional ---                        [phone], Colonoscopy, flexible; with removal of                         tumor(s), polyp(s), or other lesion(s) by snare                         technique Diagnosis Code(s):     --- Professional ---                        K63.5, Polyp of colon                         Z12.11, Encounter for screening for malignant neoplasm                         of colon CPT copyright 2019 American Medical Association. All rights reserved. The codes documented in this report are preliminary and upon coder review may  be revised to meet current compliance requirements. Dr. Ulyess Mort Lin Landsman MD, MD 05/25/2019 12:25:35 PM This report has been signed electronically. Number of Addenda: 0 Note Initiated On: 05/25/2019 11:59 AM Scope Withdrawal Time: 0 hours 9 minutes 52 seconds  Total Procedure Duration: 0 hours 13 minutes 3 seconds  Estimated Blood Loss:  Estimated blood loss: none.      [facility]

## 2019-05-25 NOTE — Anesthesia Preprocedure Evaluation (Signed)
Anesthesia Evaluation  Patient identified by MRN, date of birth, ID band Patient awake    Reviewed: Allergy & Precautions, NPO status , Patient's Chart, lab work & pertinent test results  History of Anesthesia Complications Negative for: history of anesthetic complications  Airway Mallampati: II  TM Distance: >3 FB Neck ROM: Full    Dental no notable dental hx. (+) Teeth Intact   Pulmonary neg pulmonary ROS, neg sleep apnea, neg COPD, Patient abstained from smoking.Not current smoker, former smoker,    Pulmonary exam normal breath sounds clear to auscultation       Cardiovascular Exercise Tolerance: Good METShypertension, (-) CAD and (-) Past MI (-) dysrhythmias  Rhythm:Regular Rate:Normal - Systolic murmurs    Neuro/Psych negative neurological ROS  negative psych ROS   GI/Hepatic neg GERD  ,(+)     (-) substance abuse  ,   Endo/Other  neg diabetes  Renal/GU Renal diseasenegative Renal ROS     Musculoskeletal   Abdominal   Peds  Hematology   Anesthesia Other Findings Past Medical History: No date: Arthritis No date: Back pain 02/11/2018: CKD (chronic kidney disease) stage 2, GFR 60-89 ml/min No date: Plantar fasciitis  Reproductive/Obstetrics                             Anesthesia Physical Anesthesia Plan  ASA: II  Anesthesia Plan: General   Post-op Pain Management:    Induction: Intravenous  PONV Risk Score and Plan: 2 and Ondansetron, Propofol infusion and TIVA  Airway Management Planned: Nasal Cannula  Additional Equipment: None  Intra-op Plan:   Post-operative Plan:   Informed Consent: I have reviewed the patients History and Physical, chart, labs and discussed the procedure including the risks, benefits and alternatives for the proposed anesthesia with the patient or authorized representative who has indicated his/her understanding and acceptance.     Dental  advisory given  Plan Discussed with: CRNA and Surgeon  Anesthesia Plan Comments: (Discussed risks of anesthesia with patient, including possibility of difficulty with spontaneous ventilation under anesthesia necessitating airway intervention, PONV, and rare risks such as cardiac or respiratory or neurological events. Patient understands.)        Anesthesia Quick Evaluation

## 2019-05-25 NOTE — H&P (Signed)
Cephas Darby, MD [address]  [place]  [place], [place] [address]  Main: [phone]  Fax: [phone] Pager: [phone]  Primary Care Physician:  Towanda Malkin, MD Primary Gastroenterologist:  Dr. Cephas Darby  Pre-Procedure History & Physical: HPI:  Troy Hogan is a 59 y.o. male is here for an colonoscopy.   Past Medical History:  Diagnosis Date  . Arthritis   . Back pain   . CKD (chronic kidney disease) stage 2, GFR 60-89 ml/min 02/11/2018  . Plantar fasciitis     No past surgical history on file.  Prior to Admission medications   Medication Sig Start Date End Date Taking? Authorizing Provider  aspirin EC 81 MG tablet Take 1 tablet (81 mg total) by mouth daily. 02/11/18  Yes Lada, Satira Anis, MD  losartan (COZAAR) 25 MG tablet Take 25 mg by mouth daily.   Yes [provider]  sildenafil (REVATIO) 20 MG tablet Take 1-3 tabs 0.5-2 hours prior to planned sexual activity 04/23/19  Yes Towanda Malkin, MD    Allergies as of 04/29/2019 - Review Complete 04/29/2019  Allergen Reaction Noted  . Codeine Nausea Only 02/17/2015    Family History  Problem Relation Age of Onset  . Diabetes Mother   . Hypertension Mother   . Cirrhosis Father   . Alcohol abuse Father   . Hypertension Sister   . Hypertension Brother   . Stroke Paternal Grandmother   . Hypertension Brother     Social History   Socioeconomic History  . Marital status: Married    Spouse name: vicki  . Number of children: 2  . Years of education: Not on file  . Highest education level: GED or equivalent  Occupational History  . Occupation: retired  Tobacco Use  . Smoking status: Former Smoker    Types: Cigars  . Smokeless tobacco: Former Systems developer    Types: Snuff    Quit date: 12/27/2017  . Tobacco comment: 1 week ago  Substance and Sexual Activity  . Alcohol use: Yes    Alcohol/week: 3.0 standard drinks    Types: 3 Cans of beer per week    Comment: 3 beers every  other day  . Drug use: No  . Sexual activity: Yes  Other Topics Concern  . Not on file  Social History Narrative  . Not on file   Social Determinants of Health   Financial Resource Strain:   . Difficulty of Paying Living Expenses:   Food Insecurity:   . Worried About Charity fundraiser in the Last Year:   . Arboriculturist in the Last Year:   Transportation Needs:   . Film/video editor (Medical):   Marland Kitchen Lack of Transportation (Non-Medical):   Physical Activity:   . Days of Exercise per Week:   . Minutes of Exercise per Session:   Stress:   . Feeling of Stress :   Social Connections:   . Frequency of Communication with Friends and Family:   . Frequency of Social Gatherings with Friends and Family:   . Attends Religious Services:   . Active Member of Clubs or Organizations:   . Attends Archivist Meetings:   Marland Kitchen Marital Status:   Intimate Partner Violence:   . Fear of Current or Ex-Partner:   . Emotionally Abused:   Marland Kitchen Physically Abused:   . Sexually Abused:     Review of Systems: See HPI, otherwise negative ROS  Physical Exam: BP Marland Kitchen)  134/94   Pulse 74   Temp (!) 97.3 F (36.3 C) (Temporal)   Resp 18   Ht 5\' 8"  (1.727 m)   Wt 72.6 kg   SpO2 100%   BMI 24.33 kg/m  General:   Alert,  pleasant and cooperative in NAD Head:  Normocephalic and atraumatic. Neck:  Supple; no masses or thyromegaly. Lungs:  Clear throughout to auscultation.    Heart:  Regular rate and rhythm. Abdomen:  Soft, nontender and nondistended. Normal bowel sounds, without guarding, and without rebound.   Neurologic:  Alert and  oriented x4;  grossly normal neurologically.  Impression/Plan: [name] is here for an colonoscopy to be performed for colon cancer screening  Risks, benefits, limitations, and alternatives regarding  colonoscopy have been reviewed with the patient.  Questions have been answered.  All parties agreeable.   Sherri Sear, MD  05/25/2019, 11:59 AM

## 2019-05-25 NOTE — Transfer of Care (Signed)
Immediate Anesthesia Transfer of Care Note  Patient: Troy Hogan  Procedure(s) Performed: COLONOSCOPY WITH PROPOFOL (N/A )  Patient Location: Endoscopy Unit  Anesthesia Type:General  Level of Consciousness: drowsy and patient cooperative  Airway & Oxygen Therapy: Patient Spontanous Breathing  Post-op Assessment: Report given to RN and Post -op Vital signs reviewed and stable  Post vital signs: Reviewed and stable  Last Vitals:  Vitals Value Taken Time  BP 112/79 05/25/19 1228  Temp 35.7 C 05/25/19 1227  Pulse 71 05/25/19 1234  Resp 14 05/25/19 1234  SpO2 97 % 05/25/19 1234  Vitals shown include unvalidated device data.  Last Pain:  Vitals:   05/25/19 1227  TempSrc: Temporal  PainSc: Asleep         Complications: No apparent anesthesia complications

## 2019-05-26 LAB — SURGICAL PATHOLOGY

## 2019-05-27 ENCOUNTER — Encounter: Payer: Self-pay | Admitting: *Deleted

## 2019-05-28 ENCOUNTER — Encounter: Payer: Self-pay | Admitting: Gastroenterology

## 2020-01-26 NOTE — Progress Notes (Signed)
Name: Troy Hogan   MRN: [id number]    DOB: 16-Aug-1960   Date:01/27/2020       Progress Note  Subjective  Chief Complaint  Chief Complaint  Patient presents with  . URI    Cough, fever, bodyache, sore throat for 1 week    I connected with  Troy Hogan on 01/27/20 at 11:00 AM EST by telephone and verified that I am speaking with the correct person using two identifiers.  I discussed the limitations, risks, security and privacy concerns of performing an evaluation and management service by telephone and the availability of in person appointments. The patient expressed understanding and agreed to proceed. Staff also discussed with the patient that there may be a patient responsible charge related to this service. Patient Location: Home Provider Location: [facility] Additional Individuals present: none  HPI  Patient is a 60 year old male Last visit with me was 04/23/2019 Follows up today with the above complaint.  COVID immunization status - not have vaccines  Concern for exposures - none, wears mask, staying away from people, wife was ill before, somewhat better now.  Symptoms began two weeks ago, went away some, came back again about 6-7 days ago  + cough, min production all the time, mucus is clear No marked SOB, not at rest ? fever, T - 98.7 yesterday, feeling feverish - with sweats/chills, never T to 100 + sore throat.  + mild congestion, + PND this am for first time and mostly clear, at times yellowish + loss of smell, loss of taste + N/V, vomited 4-5 days ago, not since + muscle aches No marked loose stools/diarrhea No CP,  passing out episodes Has tried mucinex max last week and seemed to help, BC  Cold, advil prn  Tob -former cigar use, quit since last time in office Comorbid conditions reviewed + HTN + CKD - mild  Staying isolated all week.   Patient Active Problem List   Diagnosis Date Noted  . Mixed hyperlipidemia 04/23/2019  . Degenerative disc disease, lumbar  04/23/2019  . Erectile dysfunction 02/17/2018  . Essential hypertension, benign 02/17/2018  . CKD (chronic kidney disease) stage 2, GFR 60-89 ml/min 02/11/2018  . Encounter for screening colonoscopy 02/07/2018    Past Surgical History:  Procedure Laterality Date  . COLONOSCOPY WITH PROPOFOL N/A 05/25/2019   Procedure: COLONOSCOPY WITH PROPOFOL;  Surgeon: Lin Landsman, MD;  Location: [facility] ENDOSCOPY;  Service: Gastroenterology;  Laterality: N/A;    Family History  Problem Relation Age of Onset  . Diabetes Mother   . Hypertension Mother   . Cirrhosis Father   . Alcohol abuse Father   . Hypertension Sister   . Hypertension Brother   . Stroke Paternal Grandmother   . Hypertension Brother     Social History   Tobacco Use  . Smoking status: Former Smoker    Types: Cigars  . Smokeless tobacco: Former Systems developer    Types: Snuff    Quit date: 12/27/2017  . Tobacco comment: 1 week ago  Substance Use Topics  . Alcohol use: Yes    Alcohol/week: 3.0 standard drinks    Types: 3 Cans of beer per week    Comment: 3 beers every other day     Current Outpatient Medications:  .  aspirin EC 81 MG tablet, Take 1 tablet (81 mg total) by mouth daily., Disp: , Rfl:  .  losartan (COZAAR) 25 MG tablet, Take 25 mg by mouth daily., Disp: , Rfl:  .  sildenafil (REVATIO) 20 MG tablet, Take 1-3 tabs 0.5-2 hours prior to planned sexual activity, Disp: 30 tablet, Rfl: 5  Allergies  Allergen Reactions  . Codeine Nausea Only    With staff assistance, above reviewed with the patient today.  ROS: As per HPI, otherwise no specific complaints on a limited and focused system review   Objective  Virtual encounter, vitals not obtained.  There is no height or weight on file to calculate BMI.  Physical Exam   Appears in NAD via conversation Pulmonary/Chest: No obvious respiratory distress. Speaking in complete sentences Neurological: Pt is alert and oriented, Speech is normal Psychiatric:  Patient has a normal mood and affect, behavior is normal. Judgment and thought content normal.   No results found for this or any previous visit (from the past 72 hour(s)).  PHQ2/9: Depression screen [facility] 2/9 01/27/2020 04/23/2019 02/07/2018  Decreased Interest 0 0 0  Down, Depressed, Hopeless 0 0 0  PHQ - 2 Score 0 0 0  Altered sleeping - 1 0  Tired, decreased energy - 1 0  Change in appetite - 0 0  Feeling bad or failure about yourself  - 0 0  Trouble concentrating - 0 0  Moving slowly or fidgety/restless - 0 0  Suicidal thoughts - 0 0  PHQ-9 Score - 2 0  Difficult doing work/chores - Not difficult at all Not difficult at all   PHQ-2/9 Result reviewed  Fall Risk: Fall Risk  01/27/2020 04/23/2019 02/07/2018  Falls in the past year? 0 0 0  Number falls in past yr: 0 0 0  Injury with Fall? 0 0 0  Follow up Falls evaluation completed - -     Assessment & Plan 1. Upper respiratory tract infection, unspecified type Patient with symptoms concerning for COVID, including loss of taste and smell noted. Symptoms started a couple weeks ago, got a little better, although then worsened again. He has not been vaccinated for COVID. Did note there is a very high chance this is COVID, and his wife has been battling symptoms recently as well. We'll check a COVID test today, and can get through the office here. Noted the treatments available for COVID presently including antibody infusions and a couple oral pills now available, although they are in very limited supply, with availability significantly limited presently. Noting his symptoms started a couple weeks ago, these are not helpful, as recommended to be started within 10 days of symptom onset. Continue to treat symptomatically, with the Mucinex product as needed to continue, also recommended alternating Tylenol and Advil products presently, staying well-hydrated and continuing with rest and isolation emphasized. Should not be out of isolation until  symptoms significantly improved, and if his COVID test returns positive, he should wear a mask at least for 7 days when first out and about.  Emphasized if symptoms worsening or not improving, the importance of following up, and if symptoms significantly worsen acutely, with marked increase shortness of breath, high fevers, chest pains, he needs to be seen more emergently in an ER setting. He was understanding of that.  - Novel Coronavirus, NAA ([facility])   I discussed the assessment and treatment plan with the patient. The patient was provided an opportunity to ask questions and all were answered. The patient agreed with the plan and demonstrated an understanding of the instructions.  Red flags and when to present for emergency care or RTC including fever >101.41F, chest pain, shortness of breath, new/worsening/un-resolving symptoms reviewed with patient at time of  visit.   The patient was advised to call back or seek an in-person evaluation if the symptoms worsen or if the condition fails to improve as anticipated.  I provided 20 minutes of non-face-to-face time during this encounter that included discussing at length patient's sx/history, pertinent pmhx, medications, treatment and follow up plan. This time also included the necessary documentation, orders, and chart review.  Towanda Malkin, MD

## 2020-01-27 ENCOUNTER — Encounter: Payer: Self-pay | Admitting: Internal Medicine

## 2020-01-27 ENCOUNTER — Ambulatory Visit (INDEPENDENT_AMBULATORY_CARE_PROVIDER_SITE_OTHER): Payer: Managed Care, Other (non HMO) | Admitting: Internal Medicine

## 2020-01-27 ENCOUNTER — Other Ambulatory Visit: Payer: Self-pay

## 2020-01-27 VITALS — Temp 97.8°F

## 2020-01-27 DIAGNOSIS — J069 Acute upper respiratory infection, unspecified: Secondary | ICD-10-CM

## 2020-01-30 LAB — SARS-COV-2, NAA 2 DAY TAT

## 2020-01-30 LAB — NOVEL CORONAVIRUS, NAA: SARS-CoV-2, NAA: DETECTED — AB

## 2021-05-19 ENCOUNTER — Encounter: Payer: Self-pay | Admitting: Family Medicine

## 2021-05-19 ENCOUNTER — Ambulatory Visit: Payer: Managed Care, Other (non HMO) | Admitting: Family Medicine

## 2021-05-19 ENCOUNTER — Other Ambulatory Visit: Payer: Self-pay

## 2021-05-19 VITALS — BP 142/80 | HR 80 | Temp 97.9°F | Resp 16 | Ht 70.0 in | Wt 162.1 lb

## 2021-05-19 DIAGNOSIS — Z5181 Encounter for therapeutic drug level monitoring: Secondary | ICD-10-CM

## 2021-05-19 DIAGNOSIS — N182 Chronic kidney disease, stage 2 (mild): Secondary | ICD-10-CM | POA: Diagnosis not present

## 2021-05-19 DIAGNOSIS — E782 Mixed hyperlipidemia: Secondary | ICD-10-CM

## 2021-05-19 DIAGNOSIS — N529 Male erectile dysfunction, unspecified: Secondary | ICD-10-CM

## 2021-05-19 DIAGNOSIS — R739 Hyperglycemia, unspecified: Secondary | ICD-10-CM

## 2021-05-19 DIAGNOSIS — I10 Essential (primary) hypertension: Secondary | ICD-10-CM | POA: Diagnosis not present

## 2021-05-19 MED ORDER — SILDENAFIL CITRATE 20 MG PO TABS
ORAL_TABLET | ORAL | 5 refills | Status: AC
  Filled 2021-05-19: qty 30, 10d supply, fill #0

## 2021-05-19 MED ORDER — LOSARTAN POTASSIUM 25 MG PO TABS
25.0000 mg | ORAL_TABLET | Freq: Every day | ORAL | 3 refills | Status: DC
  Filled 2021-05-19: qty 90, 90d supply, fill #0

## 2021-05-19 NOTE — Progress Notes (Signed)
? ? ?Patient ID: Troy Hogan, male    DOB: May 27, 1960, 61 y.o.   MRN: [id number] ? ?PCP: Towanda Malkin, MD ? ?Chief Complaint  ?Patient presents with  ? Follow-up  ? Hypertension  ? ? ?Subjective:  ? ?Troy Hogan is a 61 y.o. male, presents to clinic with CC of the following: ? ?HPI  ? ?Hypertension:  ?Not currently on meds, non-compliant and hasn't been seen in a while ?He was at specialist office and told BP was dangerously high, rechecked at home and it was around 137 SBP ?Blood pressure today is elevated . ?BP Readings from Last 3 Encounters:  ?05/19/21 (!) 142/80  ?05/25/19 134/89  ?04/23/19 132/82  ? ?Pt denies CP, SOB, exertional sx, LE edema, palpitation, Ha's, visual disturbances, lightheadedness, hypotension, syncope. ?Dietary efforts for BP?  Overall healthy but could be better - avoids bacon ? ?HLD - lipids high in the past, not on meds ?Lab Results  ?Component Value Date  ? CHOL 241 (H) 04/23/2019  ? CHOL 242 (H) 02/07/2018  ? CHOL 222 (H) 05/23/2016  ? ?Lab Results  ?Component Value Date  ? HDL 71 04/23/2019  ? HDL 64 02/07/2018  ? HDL 56 05/23/2016  ? ?Lab Results  ?Component Value Date  ? LDLCALC 149 (H) 04/23/2019  ? [place] 158 (H) 02/07/2018  ? LDLCALC 152 (H) 05/23/2016  ? ?Lab Results  ?Component Value Date  ? TRIG 98 04/23/2019  ? TRIG 94 02/07/2018  ? TRIG 72 05/23/2016  ? ?Lab Results  ?Component Value Date  ? CHOLHDL 3.4 04/23/2019  ? CHOLHDL 3.8 02/07/2018  ? CHOLHDL 4.0 05/23/2016  ? ?No results found for: LDLDIRECT ? ?Reviewed ASCVD risk and statin meds ?The 10-year ASCVD risk score (Arnett DK, et al., 2019) is: 10.2% ?  Values used to calculate the score: ?    Age: 65 years ?    Sex: Male ?    Is Non-Hispanic African American: No ?    Diabetic: No ?    Tobacco smoker: No ?    Systolic Blood Pressure: 818 mmHg ?    Is BP treated: Yes ?    HDL Cholesterol: 71 mg/dL ?    Total Cholesterol: 241 mg/dL ? ? ?Was told CKD ?Reviewed his renal function - last GFR good, will review -  explained losartan will give renal protection ?Lab Results  ?Component Value Date  ? CREATININE 0.88 04/23/2019  ? ?Lab Results  ?Component Value Date  ? BUN 17 04/23/2019  ? ?Lab Results  ?Component Value Date  ? GFRNONAA 96 04/23/2019  ? ? ? ?Patient Active Problem List  ? Diagnosis Date Noted  ? Mixed hyperlipidemia 04/23/2019  ? Degenerative disc disease, lumbar 04/23/2019  ? Erectile dysfunction 02/17/2018  ? Essential hypertension, benign 02/17/2018  ? CKD (chronic kidney disease) stage 2, GFR 60-89 ml/min 02/11/2018  ? Encounter for screening colonoscopy 02/07/2018  ? ? ? ? ?Current Outpatient Medications:  ?  aspirin EC 81 MG tablet, Take 1 tablet (81 mg total) by mouth daily., Disp: , Rfl:  ?  losartan (COZAAR) 25 MG tablet, Take 25 mg by mouth daily., Disp: , Rfl:  ?  sildenafil (REVATIO) 20 MG tablet, Take 1-3 tabs 0.5-2 hours prior to planned sexual activity, Disp: 30 tablet, Rfl: 5 ? ? ?Allergies  ?Allergen Reactions  ? Codeine Nausea Only  ? ? ? ?Social History  ? ?Tobacco Use  ? Smoking status: Former  ?  Types: Cigars  ? Smokeless  tobacco: Former  ?  Types: Snuff  ?  Quit date: 12/27/2017  ? Tobacco comments:  ?  1 week ago  ?Vaping Use  ? Vaping Use: Never used  ?Substance Use Topics  ? Alcohol use: Yes  ?  Alcohol/week: 3.0 standard drinks  ?  Types: 3 Cans of beer per week  ?  Comment: 3 beers every other day  ? Drug use: No  ?  ? ? ?Chart Review Today: ?I personally reviewed active problem list, medication list, allergies, family history, social history, health maintenance, notes from last encounter, lab results, imaging with the patient/caregiver today. ? ? ?Review of Systems  ?Constitutional: Negative.   ?HENT: Negative.    ?Eyes: Negative.   ?Respiratory: Negative.    ?Cardiovascular: Negative.   ?Gastrointestinal: Negative.   ?Endocrine: Negative.   ?Genitourinary: Negative.   ?Musculoskeletal: Negative.   ?Skin: Negative.   ?Allergic/Immunologic: Negative.   ?Neurological: Negative.    ?Hematological: Negative.   ?Psychiatric/Behavioral: Negative.    ?All other systems reviewed and are negative. ? ?   ?Objective:  ? ?Vitals:  ? 05/19/21 1113  ?BP: (!) 142/80  ?Pulse: 80  ?Resp: 16  ?Temp: 97.9 ?F (36.6 ?C)  ?TempSrc: Oral  ?SpO2: 100%  ?Weight: 162 lb 1.6 oz (73.5 kg)  ?Height: '5\' 10"'$  (1.778 m)  ?  ?Body mass index is 23.26 kg/m?. ? ?Physical Exam ?Vitals and nursing note reviewed.  ?Constitutional:   ?   General: He is not in acute distress. ?   Appearance: Normal appearance. He is well-developed, well-groomed and normal weight. He is not ill-appearing, toxic-appearing or diaphoretic.  ?HENT:  ?   Head: Normocephalic and atraumatic.  ?   Jaw: No trismus.  ?   Right Ear: External ear normal.  ?   Left Ear: External ear normal.  ?   Nose: Nose normal.  ?Eyes:  ?   General: Lids are normal. No scleral icterus.    ?   Right eye: No discharge.     ?   Left eye: No discharge.  ?   Conjunctiva/sclera: Conjunctivae normal.  ?Neck:  ?   Trachea: Trachea and phonation normal. No tracheal deviation.  ?Cardiovascular:  ?   Rate and Rhythm: Normal rate and regular rhythm.  ?   Pulses: Normal pulses.     ?     Radial pulses are 2+ on the right side and 2+ on the left side.  ?     Posterior tibial pulses are 2+ on the right side and 2+ on the left side.  ?   Heart sounds: Normal heart sounds. No murmur heard. ?  No friction rub. No gallop.  ?Pulmonary:  ?   Effort: Pulmonary effort is normal. No respiratory distress.  ?   Breath sounds: Normal breath sounds. No stridor. No wheezing, rhonchi or rales.  ?Abdominal:  ?   General: Bowel sounds are normal. There is no distension.  ?   Palpations: Abdomen is soft.  ?Musculoskeletal:  ?   Right lower leg: No edema.  ?   Left lower leg: No edema.  ?Skin: ?   General: Skin is warm and dry.  ?   Coloration: Skin is not jaundiced.  ?   Findings: No rash.  ?   Nails: There is no clubbing.  ?Neurological:  ?   Mental Status: He is alert. Mental status is at baseline.  ?    Cranial Nerves: No dysarthria or facial asymmetry.  ?  Motor: No tremor or abnormal muscle tone.  ?   Gait: Gait normal.  ?Psychiatric:     ?   Mood and Affect: Mood normal.     ?   Speech: Speech normal.     ?   Behavior: Behavior normal. Behavior is cooperative.  ?  ? ? ? ?   ?Assessment & Plan:  ? ?Problem List Items Addressed This Visit   ? ?  ? Cardiovascular and Mediastinum  ? Essential hypertension, benign - Primary (Chronic)  ?  BP today high ?Restart losartan and monitor home BP, work on [place] ?Goal to have below 130/80 ? ?  ?  ? Relevant Medications  ? sildenafil (REVATIO) 20 MG tablet  ? losartan (COZAAR) 25 MG tablet  ? Other Relevant Orders  ? COMPLETE METABOLIC PANEL WITH GFR  ?  ? Genitourinary  ? CKD (chronic kidney disease) stage 2, GFR 60-89 ml/min  ? Relevant Orders  ? COMPLETE METABOLIC PANEL WITH GFR  ?  ? Other  ? Erectile dysfunction (Chronic)  ? Relevant Medications  ? sildenafil (REVATIO) 20 MG tablet  ? Mixed hyperlipidemia  ?  Explained lipids and ASCVD risk including modifiable and non-modifiable factors - such as his age and sex ?>7.5% = statin indicated  ?Diet and exercise recommendations for HLD reviewed and handout given - info on statins also given - I expect he will benefit from one and it will be indicated ? ? ?  ?  ? Relevant Medications  ? sildenafil (REVATIO) 20 MG tablet  ? losartan (COZAAR) 25 MG tablet  ? Other Relevant Orders  ? COMPLETE METABOLIC PANEL WITH GFR  ? Lipid panel  ? ?Other Visit Diagnoses   ? ? Encounter for medication monitoring      ? Relevant Orders  ? COMPLETE METABOLIC PANEL WITH GFR  ? CBC with Differential/Platelet  ? Lipid panel  ? ?  ? ?Return in about 3 months (around 08/19/2021) for BP and cholesterol f/up in office . ? ? ? ? ? ?Delsa Grana, PA-C ?05/19/21 11:29 AM ? ?

## 2021-05-19 NOTE — Assessment & Plan Note (Signed)
Explained lipids and ASCVD risk including modifiable and non-modifiable factors - such as his age and sex ?>7.5% = statin indicated  ?Diet and exercise recommendations for HLD reviewed and handout given - info on statins also given - I expect he will benefit from one and it will be indicated ? ?

## 2021-05-19 NOTE — Assessment & Plan Note (Signed)
BP today high ?Restart losartan and monitor home BP, work on [place] ?Goal to have below 130/80 ?

## 2021-05-23 ENCOUNTER — Other Ambulatory Visit: Payer: Self-pay | Admitting: Family Medicine

## 2021-05-23 MED ORDER — ROSUVASTATIN CALCIUM 5 MG PO TABS
5.0000 mg | ORAL_TABLET | Freq: Every day | ORAL | 3 refills | Status: DC
  Filled 2021-05-23: qty 90, 90d supply, fill #0

## 2021-05-23 NOTE — Addendum Note (Signed)
Addended by: Delsa Grana on: 05/23/2021 05:34 PM ? ? Modules accepted: Orders ? ?

## 2021-05-24 ENCOUNTER — Other Ambulatory Visit: Payer: Self-pay

## 2021-05-25 LAB — LIPID PANEL
Cholesterol: 233 mg/dL — ABNORMAL HIGH (ref <200)
HDL: 77 mg/dL (ref >=40)
LDL Cholesterol (Calc): 140 mg/dL (calc) — ABNORMAL HIGH
Non-HDL Cholesterol (Calc): 156 mg/dL (calc) — ABNORMAL HIGH (ref <130)
Total CHOL/HDL Ratio: 3 (calc) (ref <5.0)
Triglycerides: 67 mg/dL (ref <150)

## 2021-05-25 LAB — CBC WITH DIFFERENTIAL/PLATELET
Absolute Monocytes: 525 cells/uL (ref 200–950)
Basophils Absolute: 42 cells/uL (ref 0–200)
Basophils Relative: 0.8 %
Eosinophils Absolute: 80 cells/uL (ref 15–500)
Eosinophils Relative: 1.5 %
HCT: 40.8 % (ref 38.5–50.0)
Hemoglobin: 13.8 g/dL (ref 13.2–17.1)
Lymphs Abs: 2056 cells/uL (ref 850–3900)
MCH: 31.8 pg (ref 27.0–33.0)
MCHC: 33.8 g/dL (ref 32.0–36.0)
MCV: 94 fL (ref 80.0–100.0)
MPV: 10.2 fL (ref 7.5–12.5)
Monocytes Relative: 9.9 %
Neutro Abs: 2597 cells/uL (ref 1500–7800)
Neutrophils Relative %: 49 %
Platelets: 263 10*3/uL (ref 140–400)
RBC: 4.34 10*6/uL (ref 4.20–5.80)
RDW: 13.1 % (ref 11.0–15.0)
Total Lymphocyte: 38.8 %
WBC: 5.3 10*3/uL (ref 3.8–10.8)

## 2021-05-25 LAB — HEMOGLOBIN A1C
Hgb A1c MFr Bld: 5.8 % of total Hgb — ABNORMAL HIGH (ref <5.7)
Mean Plasma Glucose: 120 mg/dL
eAG (mmol/L): 6.6 mmol/L

## 2021-05-25 LAB — COMPLETE METABOLIC PANEL WITH GFR
AG Ratio: 1.9 (calc) (ref 1.0–2.5)
ALT: 18 U/L (ref 9–46)
AST: 19 U/L (ref 10–35)
Albumin: 5 g/dL (ref 3.6–5.1)
Alkaline phosphatase (APISO): 45 U/L (ref 35–144)
BUN: 14 mg/dL (ref 7–25)
CO2: 27 mmol/L (ref 20–32)
Calcium: 9.5 mg/dL (ref 8.6–10.3)
Chloride: 99 mmol/L (ref 98–110)
Creat: 0.86 mg/dL (ref 0.70–1.30)
Globulin: 2.6 g/dL (calc) (ref 1.9–3.7)
Glucose, Bld: 120 mg/dL — ABNORMAL HIGH (ref 65–99)
Potassium: 4.8 mmol/L (ref 3.5–5.3)
Sodium: 134 mmol/L — ABNORMAL LOW (ref 135–146)
Total Bilirubin: 0.5 mg/dL (ref 0.2–1.2)
Total Protein: 7.6 g/dL (ref 6.1–8.1)
eGFR: 100 mL/min/{1.73_m2} (ref >=60)

## 2021-05-25 LAB — TEST AUTHORIZATION

## 2021-06-16 ENCOUNTER — Other Ambulatory Visit: Payer: Self-pay

## 2021-06-19 NOTE — Progress Notes (Deleted)
New patient visit   Patient: Troy Hogan   DOB: 04-Sep-1960   61 y.o. Male  MRN: [id number] Visit Date: 06/21/2021  Today's healthcare provider: Gwyneth Sprout, FNP   No chief complaint on file.  Subjective    Troy Hogan is a 61 y.o. male who presents today as a new patient to establish care.  HPI  ***  Past Medical History:  Diagnosis Date   Arthritis    Back pain    CKD (chronic kidney disease) stage 2, GFR 60-89 ml/min 02/11/2018   Plantar fasciitis    Past Surgical History:  Procedure Laterality Date   COLONOSCOPY WITH PROPOFOL N/A 05/25/2019   Procedure: COLONOSCOPY WITH PROPOFOL;  Surgeon: Lin Landsman, MD;  Location: [facility] ENDOSCOPY;  Service: Gastroenterology;  Laterality: N/A;   Family Status  Relation Name Status   Mother  Alive   Father  Deceased   Sister 32 Alive   Brother 1 Alive   PGM  Deceased   Brother 2 Economist   Family History  Problem Relation Age of Onset   Diabetes Mother    Hypertension Mother    Cirrhosis Father    Alcohol abuse Father    Hypertension Sister    Hypertension Brother    Stroke Paternal Grandmother    Hypertension Brother    Social History   Socioeconomic History   Marital status: Married    Spouse name: vicki   Number of children: 2   Years of education: Not on file   Highest education level: GED or equivalent  Occupational History   Occupation: retired  Tobacco Use   Smoking status: Former    Types: Cigars   Smokeless tobacco: Former    Types: Snuff    Quit date: 12/27/2017   Tobacco comments:    1 week ago  Vaping Use   Vaping Use: Never used  Substance and Sexual Activity   Alcohol use: Yes    Alcohol/week: 3.0 standard drinks of alcohol    Types: 3 Cans of beer per week    Comment: 3 beers every other day   Drug use: No   Sexual activity: Yes  Other Topics Concern   Not on file  Social History Narrative   Not on file   Social Determinants of Health   Financial Resource  Strain: Low Risk  (02/07/2018)   Overall Financial Resource Strain (CARDIA)    Difficulty of Paying Living Expenses: Not hard at all  Food Insecurity: No Food Insecurity (02/07/2018)   Hunger Vital Sign    Worried About Running Out of Food in the Last Year: Never true    Ran Out of Food in the Last Year: Never true  Transportation Needs: No Transportation Needs (02/07/2018)   PRAPARE - Hydrologist (Medical): No    Lack of Transportation (Non-Medical): No  Physical Activity: Inactive (02/07/2018)   Exercise Vital Sign    Days of Exercise per Week: 0 days    Minutes of Exercise per Session: 0 min  Stress: No Stress Concern Present (02/07/2018)   [place]    Feeling of Stress : Not at all  Social Connections: Moderately Integrated (02/07/2018)   Social Connection and Isolation Panel [NHANES]    Frequency of Communication with Friends and Family: Twice a week    Frequency of Social Gatherings with Friends and Family: Three times a week  Attends Religious Services: More than 4 times per year    Active Member of Clubs or Organizations: No    Attends Archivist Meetings: Never    Marital Status: Married   Outpatient Medications Prior to Visit  Medication Sig   aspirin EC 81 MG tablet Take 1 tablet (81 mg total) by mouth daily.   losartan (COZAAR) 25 MG tablet Take 1 tablet (25 mg total) by mouth daily.   rosuvastatin (CRESTOR) 5 MG tablet Take 1 tablet (5 mg total) by mouth at bedtime.   sildenafil (REVATIO) 20 MG tablet Take 1-3 tabs 0.5-2 hours prior to planned sexual activity   No facility-administered medications prior to visit.   Allergies  Allergen Reactions   Codeine Nausea Only    Immunization History  Administered Date(s) Administered   Influenza,inj,Quad PF,6+ Mos 02/07/2018    Health Maintenance  Topic Date Due   COVID-19 Vaccine (1) Never done   Zoster Vaccines-  Shingrix (1 of 2) 08/19/2021 (Originally 05/04/2012)   INFLUENZA VACCINE  08/08/2021   COLONOSCOPY (Pts 45-9yr Insurance coverage will need to be confirmed)  05/24/2024   TETANUS/TDAP  01/08/2025   Hepatitis C Screening  Completed   HIV Screening  Completed   HPV VACCINES  Aged Out    Patient Care Team: TDelsa Grana PA-C as PCP - General (Family Medicine)  Review of Systems  {Labs  Heme  Chem  Endocrine  Serology  Results Review (optional):23779}   Objective    There were no vitals taken for this visit. {Show previous vital signs (optional):23777}  Physical Exam ***  Depression Screen    05/19/2021   11:12 AM 01/27/2020    8:34 AM 04/23/2019    8:42 AM 02/07/2018    2:30 PM  PHQ 2/9 Scores  PHQ - 2 Score 0 0 0 0  PHQ- 9 Score 0  2 0   No results found for any visits on 06/21/21.  Assessment & Plan     ***  No follow-ups on file.     {provider attestation***:1}   EGwyneth Sprout [phone](phone) [phone](fax)  [place]

## 2021-06-21 ENCOUNTER — Ambulatory Visit: Payer: Managed Care, Other (non HMO) | Admitting: Family Medicine

## 2021-08-17 ENCOUNTER — Ambulatory Visit: Payer: Managed Care, Other (non HMO) | Admitting: Family Medicine

## 2021-08-17 DIAGNOSIS — E782 Mixed hyperlipidemia: Secondary | ICD-10-CM

## 2021-08-17 DIAGNOSIS — I10 Essential (primary) hypertension: Secondary | ICD-10-CM

## 2022-08-30 ENCOUNTER — Ambulatory Visit: Payer: Self-pay

## 2022-08-30 NOTE — Progress Notes (Signed)
BP 138/82   Pulse 98   Temp 98 F (36.7 C) (Oral)   Resp 16   Ht 5\' 10"  (1.778 m)   Wt 153 lb 12.8 oz (69.8 kg)   SpO2 98%   BMI 22.07 kg/m    Subjective:    Patient ID: Troy Hogan, male    DOB: Dec 29, 1960, 62 y.o.   MRN: [id number]  HPI: Troy Hogan is a 62 y.o. male  Chief Complaint  Patient presents with   Back Pain    Low back pain, HX of bulging disc   Hypertension   Hyperlipidemia   BACK PAIN- history of DDD lumbar Duration: 4 days chronic paint but has not had issues with his back in 5 years Mechanism of injury: no trauma, reports he was coughing and his back went out Location: low back Onset: sudden Severity: moderate Quality: sharp Frequency: constant Radiation: R leg above the knee Aggravating factors: movement, walking, laying, bending, prolonged sitting, and coughing Alleviating factors: rest and NSAIDs Status: fluctuating Treatments attempted: rest and ibuprofen  Relief with NSAIDs?: mild Nighttime pain:  no Paresthesias / decreased sensation:  no Bowel / bladder incontinence:  no Fevers:  no Dysuria / urinary frequency:  no  Will start steroid taper, muscle relaxer, and naproxen  Hypertension:  -Medications: losartan 25 mg daily -Patient is compliant with above medications and reports no side effects. -Checking BP at home (average): 130s -Denies any SOB, CP, vision changes, LE edema or symptoms of hypotension -Diet: recommend DASH diet  -Exercise: recommend 150 min of physical activity weekly     HLD:  -Medications: rosuvastatin 5 mg daily -Patient is compliant with above medications and reports no side effects.  -Last lipid panel:  Lipid Panel     Component Value Date/Time   CHOL 233 (H) 05/19/2021 1149   CHOL 222 (H) 05/23/2016 0827   TRIG 67 05/19/2021 1149   HDL 77 05/19/2021 1149   HDL 56 05/23/2016 0827   CHOLHDL 3.0 05/19/2021 1149   LDLCALC 140 (H) 05/19/2021 1149   LABVLDL 14 05/23/2016 0827    The 10-year ASCVD risk score  (Arnett DK, et al., 2019) is: 9.6%   Values used to calculate the score:     Age: 89 years     Sex: Male     Is Non-Hispanic African American: No     Diabetic: No     Tobacco smoker: No     Systolic Blood Pressure: 138 mmHg     Is BP treated: Yes     HDL Cholesterol: 77 mg/dL     Total Cholesterol: 233 mg/dL  Relevant past medical, surgical, family and social history reviewed and updated as indicated. Interim medical history since our last visit reviewed. Allergies and medications reviewed and updated.  Review of Systems  Constitutional: Negative for fever or weight change.  Respiratory: Negative for cough and shortness of breath.   Cardiovascular: Negative for chest pain or palpitations.  Gastrointestinal: Negative for abdominal pain, no bowel changes.  Musculoskeletal: Negative for gait problem or joint swelling. Positive back pain Skin: Negative for rash.  Neurological: Negative for dizziness or headache.  No other specific complaints in a complete review of systems (except as listed in HPI above).      Objective:    BP 138/82   Pulse 98   Temp 98 F (36.7 C) (Oral)   Resp 16   Ht 5\' 10"  (1.778 m)   Wt 153 lb 12.8 oz (69.8 kg)  SpO2 98%   BMI 22.07 kg/m   Wt Readings from Last 3 Encounters:  08/31/22 153 lb 12.8 oz (69.8 kg)  05/19/21 162 lb 1.6 oz (73.5 kg)  05/25/19 160 lb (72.6 kg)    Physical Exam  Constitutional: Patient appears well-developed and well-nourished.  No distress.  HEENT: head atraumatic, normocephalic, pupils equal and reactive to light, neck supple Cardiovascular: Normal rate, regular rhythm and normal heart sounds.  No murmur heard. No BLE edema. Pulmonary/Chest: Effort normal and breath sounds normal. No respiratory distress. Abdominal: Soft.  There is no tenderness. Back: midline tenderness in lumbar region, positive straight leg test on right Psychiatric: Patient has a normal mood and affect. behavior is normal. Judgment and thought  content normal.  Results for orders placed or performed in visit on 05/19/21  COMPLETE METABOLIC PANEL WITH GFR  Result Value Ref Range   Glucose, Bld 120 (H) 65 - 99 mg/dL   BUN 14 7 - 25 mg/dL   Creat 2.95 6.21 - 3.08 mg/dL   eGFR 657 > OR = 60 QI/ONG/2.95M8   BUN/Creatinine Ratio NOT APPLICABLE 6 - 22 (calc)   Sodium 134 (L) 135 - 146 mmol/L   Potassium 4.8 3.5 - 5.3 mmol/L   Chloride 99 98 - 110 mmol/L   CO2 27 20 - 32 mmol/L   Calcium 9.5 8.6 - 10.3 mg/dL   Total Protein 7.6 6.1 - 8.1 g/dL   Albumin 5.0 3.6 - 5.1 g/dL   Globulin 2.6 1.9 - 3.7 g/dL (calc)   AG Ratio 1.9 1.0 - 2.5 (calc)   Total Bilirubin 0.5 0.2 - 1.2 mg/dL   Alkaline phosphatase (APISO) 45 35 - 144 U/L   AST 19 10 - 35 U/L   ALT 18 9 - 46 U/L  CBC with Differential/Platelet  Result Value Ref Range   WBC 5.3 3.8 - 10.8 Thousand/uL   RBC 4.34 4.20 - 5.80 Million/uL   Hemoglobin 13.8 13.2 - 17.1 g/dL   HCT 41.3 24.4 - 01.0 %   MCV 94.0 80.0 - 100.0 fL   MCH 31.8 27.0 - 33.0 pg   MCHC 33.8 32.0 - 36.0 g/dL   RDW 27.2 53.6 - 64.4 %   Platelets 263 140 - 400 Thousand/uL   MPV 10.2 7.5 - 12.5 fL   Neutro Abs 2,597 1,500 - 7,800 cells/uL   Lymphs Abs 2,056 850 - 3,900 cells/uL   Absolute Monocytes 525 200 - 950 cells/uL   Eosinophils Absolute 80 15 - 500 cells/uL   Basophils Absolute 42 0 - 200 cells/uL   Neutrophils Relative % 49 %   Total Lymphocyte 38.8 %   Monocytes Relative 9.9 %   Eosinophils Relative 1.5 %   Basophils Relative 0.8 %  Lipid panel  Result Value Ref Range   Cholesterol 233 (H) <200 mg/dL   HDL 77 > OR = 40 mg/dL   Triglycerides 67 <034 mg/dL   LDL Cholesterol (Calc) 140 (H) mg/dL (calc)   Total CHOL/HDL Ratio 3.0 <5.0 (calc)   Non-HDL Cholesterol (Calc) 156 (H) <130 mg/dL (calc)  Hemoglobin V4Q  Result Value Ref Range   Hgb A1c MFr Bld 5.8 (H) <5.7 % of total Hgb   Mean Plasma Glucose 120 mg/dL   eAG (mmol/L) 6.6 mmol/L  TEST AUTHORIZATION  Result Value Ref Range   TEST  NAME: HEMOGLOBIN A1c WITH eAG    TEST CODE: [id number]    CLIENT CONTACT: LEISA TAPIA    REPORT ALWAYS MESSAGE SIGNATURE  Assessment & Plan:   Problem List Items Addressed This Visit       Cardiovascular and Mediastinum   Essential hypertension, benign - Primary (Chronic)    Labs ordered refill sent      Relevant Medications   rosuvastatin (CRESTOR) 5 MG tablet   losartan (COZAAR) 25 MG tablet   Other Relevant Orders   COMPLETE METABOLIC PANEL WITH GFR   CBC with Differential/Platelet     Musculoskeletal and Integument   Degenerative disc disease, lumbar    start steroid taper, muscle relaxer and naproxen, can use heat therapy, if no improvement will send to physcial therapy.       Relevant Medications   predniSONE (STERAPRED UNI-PAK 21 TAB) 10 MG (21) TBPK tablet   cyclobenzaprine (FLEXERIL) 10 MG tablet   naproxen (NAPROSYN) 500 MG tablet     Other   Mixed hyperlipidemia    Labs ordered, refill sent      Relevant Medications   rosuvastatin (CRESTOR) 5 MG tablet   losartan (COZAAR) 25 MG tablet   Other Relevant Orders   COMPLETE METABOLIC PANEL WITH GFR   Lipid panel   Other Visit Diagnoses     Acute bilateral low back pain with right-sided sciatica       start steroid taper, muscle relaxer and naproxen, can use heat therapy, if no improvement will send to physcial therapy.   Relevant Medications   predniSONE (STERAPRED UNI-PAK 21 TAB) 10 MG (21) TBPK tablet   cyclobenzaprine (FLEXERIL) 10 MG tablet   naproxen (NAPROSYN) 500 MG tablet   Screening for diabetes mellitus       Relevant Orders   COMPLETE METABOLIC PANEL WITH GFR   Hemoglobin A1c        Follow up plan: Return if symptoms worsen or fail to improve.

## 2022-08-30 NOTE — Telephone Encounter (Addendum)
Chief Complaint: Back pain lower Symptoms: 8/10 pain, hard to walk around, Tylenol ineffective Frequency: Onset yesterday Pertinent Negatives: Patient denies other symptoms Disposition: [] ED /[] Urgent Care (no appt availability in office) / [x] Appointment(In office/virtual)/ []  [facility]/ [] Home Care/ [] Refused Recommended Disposition /[] [facility]/ []  Follow-up with PCP Additional Notes: Advised no availability today in office. I asked about virtual UC visit, he says he hasn't gotten in his email since he retired and not sure how to set up MyChart on the phone, his wife would have to take care of all this when she's home. Advised OV tomorrow with a different provider, he agrees to tomorrow, scheduled patient. Advised I will send this to [facility] in case of any openings today, someone will call him back. He says he would appreciate that, but if tomorrow is all that's available, he understands.   Reason for Disposition  [1] SEVERE back pain (e.g., excruciating, unable to do any normal activities) AND [2] not improved 2 hours after pain medicine  Answer Assessment - Initial Assessment Questions 1. ONSET: "When did the pain begin?"      Yesterday 2. LOCATION: "Where does it hurt?" (upper, mid or lower back)     Lower back 3. SEVERITY: "How bad is the pain?"  (e.g., Scale 1-10; mild, moderate, or severe)   - MILD (1-3): Doesn't interfere with normal activities.    - MODERATE (4-7): Interferes with normal activities or awakens from sleep.    - SEVERE (8-10): Excruciating pain, unable to do any normal activities.      8 4. PATTERN: "Is the pain constant?" (e.g., yes, no; constant, intermittent)      Comes and goes with movement 5. RADIATION: "Does the pain shoot into your legs or somewhere else?"     Shoots to groin and legs, sharp 6. CAUSE:  "What do you think is causing the back pain?"      Bulging disc 7. BACK OVERUSE:  "Any recent lifting of heavy objects, strenuous  work or exercise?"     No 8. MEDICINES: "What have you taken so far for the pain?" (e.g., nothing, acetaminophen, NSAIDS)     Tylenol, not helping 9. NEUROLOGIC SYMPTOMS: "Do you have any weakness, numbness, or problems with bowel/bladder control?"     No 10. OTHER SYMPTOMS: "Do you have any other symptoms?" (e.g., fever, abdomen pain, burning with urination, blood in urine)       No  Protocols used: Back Pain-A-AH

## 2022-08-31 ENCOUNTER — Ambulatory Visit: Payer: Managed Care, Other (non HMO) | Admitting: Nurse Practitioner

## 2022-08-31 ENCOUNTER — Other Ambulatory Visit: Payer: Self-pay

## 2022-08-31 ENCOUNTER — Encounter: Payer: Self-pay | Admitting: Nurse Practitioner

## 2022-08-31 VITALS — BP 138/82 | HR 98 | Temp 98.0°F | Resp 16 | Ht 70.0 in | Wt 153.8 lb

## 2022-08-31 DIAGNOSIS — E782 Mixed hyperlipidemia: Secondary | ICD-10-CM

## 2022-08-31 DIAGNOSIS — M5441 Lumbago with sciatica, right side: Secondary | ICD-10-CM

## 2022-08-31 DIAGNOSIS — Z131 Encounter for screening for diabetes mellitus: Secondary | ICD-10-CM

## 2022-08-31 DIAGNOSIS — M5136 Other intervertebral disc degeneration, lumbar region: Secondary | ICD-10-CM

## 2022-08-31 DIAGNOSIS — I10 Essential (primary) hypertension: Secondary | ICD-10-CM | POA: Diagnosis not present

## 2022-08-31 MED ORDER — PREDNISONE 10 MG PO TABS
ORAL_TABLET | ORAL | 0 refills | Status: AC
Start: 2022-08-31 — End: 2022-09-06
  Filled 2022-08-31 (×2): qty 21, 6d supply, fill #0

## 2022-08-31 MED ORDER — ROSUVASTATIN CALCIUM 5 MG PO TABS
5.0000 mg | ORAL_TABLET | Freq: Every day | ORAL | 3 refills | Status: DC
Start: 2022-08-31 — End: 2023-10-11
  Filled 2022-08-31 (×2): qty 90, 90d supply, fill #0

## 2022-08-31 MED ORDER — NAPROXEN 500 MG PO TABS
500.0000 mg | ORAL_TABLET | Freq: Two times a day (BID) | ORAL | 0 refills | Status: DC
Start: 2022-08-31 — End: 2023-10-11
  Filled 2022-08-31 (×2): qty 30, 15d supply, fill #0

## 2022-08-31 MED ORDER — LOSARTAN POTASSIUM 25 MG PO TABS
25.0000 mg | ORAL_TABLET | Freq: Every day | ORAL | 3 refills | Status: DC
Start: 2022-08-31 — End: 2023-10-11
  Filled 2022-08-31 (×3): qty 90, 90d supply, fill #0

## 2022-08-31 MED ORDER — CYCLOBENZAPRINE HCL 10 MG PO TABS
10.0000 mg | ORAL_TABLET | Freq: Three times a day (TID) | ORAL | 0 refills | Status: DC | PRN
Start: 2022-08-31 — End: 2023-10-11
  Filled 2022-08-31 (×2): qty 30, 10d supply, fill #0

## 2022-08-31 NOTE — Assessment & Plan Note (Signed)
Labs ordered, refill sent.

## 2022-08-31 NOTE — Assessment & Plan Note (Signed)
Labs ordered refill sent

## 2022-08-31 NOTE — Assessment & Plan Note (Signed)
start steroid taper, muscle relaxer and naproxen, can use heat therapy, if no improvement will send to physcial therapy.

## 2022-09-01 LAB — LIPID PANEL
Cholesterol: 219 mg/dL — ABNORMAL HIGH (ref <200)
HDL: 69 mg/dL (ref >=40)
LDL Cholesterol (Calc): 133 mg/dL — ABNORMAL HIGH
Non-HDL Cholesterol (Calc): 150 mg/dL — ABNORMAL HIGH (ref <130)
Total CHOL/HDL Ratio: 3.2 (calc) (ref <5.0)
Triglycerides: 74 mg/dL (ref <150)

## 2022-09-01 LAB — HEMOGLOBIN A1C
Hgb A1c MFr Bld: 5.9 %{Hb} — ABNORMAL HIGH (ref <5.7)
Mean Plasma Glucose: 123 mg/dL
eAG (mmol/L): 6.8 mmol/L

## 2022-09-01 LAB — CBC WITH DIFFERENTIAL/PLATELET
Absolute Monocytes: 531 {cells}/uL (ref 200–950)
Basophils Absolute: 30 {cells}/uL (ref 0–200)
Basophils Relative: 0.5 %
Eosinophils Absolute: 83 {cells}/uL (ref 15–500)
Eosinophils Relative: 1.4 %
HCT: 43.3 % (ref 38.5–50.0)
Hemoglobin: 14.4 g/dL (ref 13.2–17.1)
Lymphs Abs: 1988 {cells}/uL (ref 850–3900)
MCH: 31.8 pg (ref 27.0–33.0)
MCHC: 33.3 g/dL (ref 32.0–36.0)
MCV: 95.6 fL (ref 80.0–100.0)
MPV: 10 fL (ref 7.5–12.5)
Monocytes Relative: 9 %
Neutro Abs: 3269 {cells}/uL (ref 1500–7800)
Neutrophils Relative %: 55.4 %
Platelets: 268 10*3/uL (ref 140–400)
RBC: 4.53 10*6/uL (ref 4.20–5.80)
RDW: 13.9 % (ref 11.0–15.0)
Total Lymphocyte: 33.7 %
WBC: 5.9 10*3/uL (ref 3.8–10.8)

## 2022-09-01 LAB — COMPLETE METABOLIC PANEL WITH GFR
AG Ratio: 2 (calc) (ref 1.0–2.5)
ALT: 17 U/L (ref 9–46)
AST: 18 U/L (ref 10–35)
Albumin: 4.9 g/dL (ref 3.6–5.1)
Alkaline phosphatase (APISO): 55 U/L (ref 35–144)
BUN: 18 mg/dL (ref 7–25)
CO2: 28 mmol/L (ref 20–32)
Calcium: 10.1 mg/dL (ref 8.6–10.3)
Chloride: 102 mmol/L (ref 98–110)
Creat: 1.16 mg/dL (ref 0.70–1.35)
Globulin: 2.4 g/dL (ref 1.9–3.7)
Glucose, Bld: 81 mg/dL (ref 65–99)
Potassium: 4.7 mmol/L (ref 3.5–5.3)
Sodium: 139 mmol/L (ref 135–146)
Total Bilirubin: 0.4 mg/dL (ref 0.2–1.2)
Total Protein: 7.3 g/dL (ref 6.1–8.1)
eGFR: 72 mL/min/{1.73_m2} (ref >=60)

## 2023-05-10 ENCOUNTER — Ambulatory Visit
Admission: RE | Admit: 2023-05-10 | Discharge: 2023-05-10 | Disposition: A | Discharge status: Home / Self Care | Attending: Family Medicine | Admitting: Family Medicine

## 2023-05-10 ENCOUNTER — Ambulatory Visit
Admission: RE | Admit: 2023-05-10 | Discharge: 2023-05-10 | Disposition: A | Discharge status: Home / Self Care | Source: Ambulatory Visit | Attending: Family Medicine

## 2023-05-10 ENCOUNTER — Ambulatory Visit: Admitting: Family Medicine

## 2023-05-10 ENCOUNTER — Encounter: Payer: Self-pay | Admitting: Family Medicine

## 2023-05-10 ENCOUNTER — Other Ambulatory Visit: Payer: Self-pay

## 2023-05-10 VITALS — BP 132/76 | HR 79 | Temp 98.1°F | Resp 16 | Ht 70.0 in | Wt 151.0 lb

## 2023-05-10 DIAGNOSIS — R29818 Other symptoms and signs involving the nervous system: Secondary | ICD-10-CM

## 2023-05-10 DIAGNOSIS — M5441 Lumbago with sciatica, right side: Secondary | ICD-10-CM

## 2023-05-10 DIAGNOSIS — R053 Chronic cough: Secondary | ICD-10-CM

## 2023-05-10 DIAGNOSIS — J310 Chronic rhinitis: Secondary | ICD-10-CM | POA: Diagnosis not present

## 2023-05-10 DIAGNOSIS — M51369 Other intervertebral disc degeneration, lumbar region without mention of lumbar back pain or lower extremity pain: Secondary | ICD-10-CM

## 2023-05-10 DIAGNOSIS — G8929 Other chronic pain: Secondary | ICD-10-CM

## 2023-05-10 MED ORDER — FLUTICASONE PROPIONATE 50 MCG/ACT NA SUSP
2.0000 | Freq: Every day | NASAL | 6 refills | Status: AC
Start: 2023-05-10 — End: ?
  Filled 2023-05-10: qty 16, 30d supply, fill #0

## 2023-05-10 MED ORDER — ALBUTEROL SULFATE HFA 108 (90 BASE) MCG/ACT IN AERS
2.0000 | INHALATION_SPRAY | Freq: Four times a day (QID) | RESPIRATORY_TRACT | 2 refills | Status: AC | PRN
  Filled 2023-05-10: qty 6.7, 30d supply, fill #0

## 2023-05-10 MED ORDER — LEVOCETIRIZINE DIHYDROCHLORIDE 5 MG PO TABS
5.0000 mg | ORAL_TABLET | Freq: Every evening | ORAL | 1 refills | Status: AC
Start: 2023-05-10 — End: ?
  Filled 2023-05-10: qty 90, 90d supply, fill #0

## 2023-05-10 NOTE — Progress Notes (Unsigned)
 Patient ID: Troy Hogan, male    DOB: [birth year], 63 y.o.   MRN: [id number]  PCP: Adeline Hone, PA-C  Chief Complaint  Patient presents with   Cough    Chronic, productive. X6 months- 1 yr.   Consult    Assess for sleep apnea. Patient feels like "he chokes at night"   Back Pain    R lower back, chronic. X6-8 months    Subjective:   Troy Hogan is a 63 y.o. 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 Episode onset: 6 months of coughing more. Associated symptoms include nasal congestion, postnasal drip, rhinorrhea, shortness of breath, weight loss and wheezing. Pertinent negatives include no chest pain, chills, ear congestion, ear pain, fever, headaches, heartburn, hemoptysis, myalgias, sore throat or sweats. The symptoms are aggravated by exercise. Risk factors for lung disease include smoking/tobacco exposure and occupational exposure. He has tried nothing for the symptoms. The treatment provided no relief. His past medical history is significant for environmental allergies. There is no history of asthma, bronchiectasis, bronchitis, COPD, emphysema or pneumonia. only past infection was early covid  Back Pain Associated symptoms include weight loss. Pertinent negatives include no chest pain, fever or headaches.      Patient Active Problem List   Diagnosis Date Noted   Mixed  hyperlipidemia 04/23/2019   Degenerative disc disease, lumbar 04/23/2019   Erectile dysfunction 02/17/2018   Essential hypertension, benign 02/17/2018   CKD (chronic kidney disease) stage 2, GFR 60-89 ml/min 02/11/2018   Encounter for screening colonoscopy 02/07/2018      Current Outpatient Medications:    aspirin  EC 81 MG tablet, Take 1 tablet (81 mg total) by mouth daily., Disp: , Rfl:    cyclobenzaprine  (FLEXERIL ) 10 MG tablet, Take 1 tablet (10 mg total) by mouth 3 (three) times daily as needed for muscle spasms., Disp: 30 tablet, Rfl: 0   losartan  (COZAAR ) 25 MG tablet, Take 1 tablet (25 mg total) by mouth daily., Disp: 90 tablet, Rfl: 3   naproxen  (NAPROSYN ) 500 MG tablet, Take 1 tablet (500 mg total) by mouth 2 (two) times daily with a meal., Disp: 30 tablet, Rfl: 0   rosuvastatin  (CRESTOR ) 5 MG tablet, Take 1 tablet (5 mg total) by mouth at bedtime., Disp: 90 tablet, Rfl: 3   sildenafil  (REVATIO ) 20 MG tablet, Take 1-3 tabs 0.5-2 hours prior to planned sexual activity, Disp: 30 tablet, Rfl: 5   Allergies  Allergen Reactions   Codeine Nausea Only     Social History   Tobacco Use   Smoking status: Former    Types: Cigars   Smokeless tobacco: Former    Types: Snuff    Quit date: 12/27/2017   Tobacco comments:    1 week ago  Vaping Use   Vaping status: Never Used  Substance Use Topics  Alcohol use: Yes    Alcohol/week: 3.0 standard drinks of alcohol    Types: 3 Cans of beer per week    Comment: 3 beers every other day   Drug use: No      Chart Review Today: I personally reviewed active problem list, medication list, allergies, family history, social history, health maintenance, notes from last encounter, lab results, imaging with the patient/caregiver today.   Review of Systems  Constitutional:  Positive for weight loss. Negative for chills and fever.  HENT:  Positive for postnasal drip and rhinorrhea. Negative for ear pain and sore throat.   Eyes: Negative.    Respiratory:  Positive for cough, shortness of breath and wheezing. Negative for hemoptysis.   Cardiovascular: Negative.  Negative for chest pain.  Gastrointestinal: Negative.  Negative for heartburn.  Endocrine: Negative.   Genitourinary: Negative.   Musculoskeletal:  Positive for back pain. Negative for myalgias.  Skin: Negative.   Allergic/Immunologic: Positive for environmental allergies.  Neurological: Negative.  Negative for headaches.  Hematological: Negative.   Psychiatric/Behavioral: Negative.    All other systems reviewed and are negative.      Objective:   Vitals:   05/10/23 1013  BP: 132/76  Pulse: 79  Resp: 16  Temp: 98.1 F (36.7 C)  TempSrc: Oral  SpO2: 99%  Weight: 151 lb (68.5 kg)  Height: 5\' 10"  (1.778 m)    Body mass index is 21.67 kg/m.  Physical Exam Vitals and nursing note reviewed.  Constitutional:      General: He is not in acute distress.    Appearance: Normal appearance. He is well-developed. He is not ill-appearing, toxic-appearing or diaphoretic.  HENT:     Head: Normocephalic and atraumatic.     Nose: Congestion and rhinorrhea present.     Mouth/Throat:     Mouth: Mucous membranes are moist.     Pharynx: Oropharynx is clear.  Eyes:     General:        Right eye: No discharge.        Left eye: No discharge.     Conjunctiva/sclera: Conjunctivae normal.  Neck:     Trachea: No tracheal deviation.  Cardiovascular:     Rate and Rhythm: Normal rate and regular rhythm.     Pulses: Normal pulses.     Heart sounds: Normal heart sounds.  Pulmonary:     Effort: Pulmonary effort is normal. No respiratory distress.     Breath sounds: Normal breath sounds. No stridor. No wheezing, rhonchi or rales.  Skin:    General: Skin is warm and dry.     Findings: No rash.  Neurological:     Mental Status: He is alert.     Motor: No abnormal muscle tone.     Coordination: Coordination normal.  Psychiatric:        Mood and Affect: Mood normal.         Behavior: Behavior normal.      Results for orders placed or performed in visit on 08/31/22  COMPLETE METABOLIC PANEL WITH GFR   Collection Time: 08/31/22 12:09 PM  Result Value Ref Range   Glucose, Bld 81 65 - 99 mg/dL   BUN 18 7 - 25 mg/dL   Creat 6.57 8.46 - 9.62 mg/dL   eGFR 72 > OR = 60 XB/MWU/1.32G4   BUN/Creatinine Ratio SEE NOTE: 6 - 22 (calc)   Sodium 139 135 - 146 mmol/L   Potassium 4.7 3.5 - 5.3 mmol/L   Chloride 102 98 -  110 mmol/L   CO2 28 20 - 32 mmol/L   Calcium  10.1 8.6 - 10.3 mg/dL   Total Protein 7.3 6.1 - 8.1 g/dL   Albumin 4.9 3.6 - 5.1 g/dL   Globulin 2.4 1.9 - 3.7 g/dL (calc)   AG Ratio 2.0 1.0 - 2.5 (calc)   Total Bilirubin 0.4 0.2 - 1.2 mg/dL   Alkaline phosphatase (APISO) 55 35 - 144 U/L   AST 18 10 - 35 U/L   ALT 17 9 - 46 U/L  CBC with Differential/Platelet   Collection Time: 08/31/22 12:09 PM  Result Value Ref Range   WBC 5.9 3.8 - 10.8 Thousand/uL   RBC 4.53 4.20 - 5.80 Million/uL   Hemoglobin 14.4 13.2 - 17.1 g/dL   HCT 16.1 09.6 - 04.5 %   MCV 95.6 80.0 - 100.0 fL   MCH 31.8 27.0 - 33.0 pg   MCHC 33.3 32.0 - 36.0 g/dL   RDW 40.9 81.1 - 91.4 %   Platelets 268 140 - 400 Thousand/uL   MPV 10.0 7.5 - 12.5 fL   Neutro Abs 3,269 1,500 - 7,800 cells/uL   Lymphs Abs 1,988 850 - 3,900 cells/uL   Absolute Monocytes 531 200 - 950 cells/uL   Eosinophils Absolute 83 15 - 500 cells/uL   Basophils Absolute 30 0 - 200 cells/uL   Neutrophils Relative % 55.4 %   Total Lymphocyte 33.7 %   Monocytes Relative 9.0 %   Eosinophils Relative 1.4 %   Basophils Relative 0.5 %  Lipid panel   Collection Time: 08/31/22 12:09 PM  Result Value Ref Range   Cholesterol 219 (H) <200 mg/dL   HDL 69 > OR = 40 mg/dL   Triglycerides 74 <782 mg/dL   LDL Cholesterol (Calc) 133 (H) mg/dL (calc)   Total CHOL/HDL Ratio 3.2 <5.0 (calc)   Non-HDL Cholesterol (Calc) 150 (H) <130 mg/dL (calc)  Hemoglobin N5A   Collection Time: 08/31/22 12:09 PM  Result Value Ref Range    Hgb A1c MFr Bld 5.9 (H) <5.7 % of total Hgb   Mean Plasma Glucose 123 mg/dL   eAG (mmol/L) 6.8 mmol/L       Assessment & Plan:     ICD-10-CM   1. Suspected sleep apnea  R29.818 Ambulatory referral to Pulmonology   eval with sleep medicine    2. Chronic cough  R05.3 Ambulatory referral to Pulmonology    DG Chest 2 View    albuterol  (VENTOLIN  HFA) 108 (90 Base) MCG/ACT inhaler   for months, encouraged to try antihistamine and inhaler, CXR to eval but lungs CTA A&P, no sx concerning for CAP    3. Rhinitis, unspecified type  J31.0 levocetirizine (XYZAL ) 5 MG tablet    fluticasone  (FLONASE ) 50 MCG/ACT nasal spray   cough likely caused by postnasal drip/allergies, recommend tx for allergic rhinitis    4. Degenerative disc disease, lumbar  M51.369    Pt has NSAIDs, muscle relaxers, can do heat tx, if sx flare up and do not improve can do spine referral or PT eval    5. Chronic bilateral low back pain with right-sided sciatica  G89.29    M54.41    see above      Return for 1 month PCP routine and f/up breathing/nasal sx.     Adeline Hone, PA-C 05/10/23 10:31 AM

## 2023-05-20 ENCOUNTER — Ambulatory Visit: Admitting: Sleep Medicine

## 2023-06-10 ENCOUNTER — Ambulatory Visit: Admitting: Family Medicine

## 2023-10-01 ENCOUNTER — Ambulatory Visit: Payer: Self-pay

## 2023-10-01 NOTE — Telephone Encounter (Signed)
  FYI Only or Action Required?: Action required by provider: medication refill request.  Patient was last seen in primary care on 05/10/2023 by Tapia, Leisa, PA-C.  Called Nurse Triage reporting Back Pain.  Symptoms began a week ago.  Interventions attempted: Rest, hydration, or home remedies.  Symptoms are: unchanged.  Triage Disposition: See PCP When Office is Open (Within 3 Days)  Patient/caregiver understands and will follow disposition?: Yes    Copied from CRM #[id number]. Topic: Clinical - Red Word Triage >> Oct 01, 2023  4:08 PM Donee H wrote: Red Word that prompted transfer to Nurse Triage: Patient states experiencing back pain. He said this as been going on for about 5 days now. He states feeling like he may have ruptured a disc. He wanted to know is pcp can prescribe him predniSONE  (DELTASONE ) 10 MG tablet Reason for Disposition  [1] MODERATE back pain (e.g., interferes with normal activities) AND [2] present > 3 days  Answer Assessment - Initial Assessment Questions Additional info: Patient has chronic lower back pain from known bulging disks, he has not needed any medication for 1.5 years but now feeling pain again up to 10/10 when acting up. He has not tried any medications or treatments at this time. Requesting prednisone  rx. Scheduled next available acute visit with pcp on 10/02/23.    1. ONSET: When did the pain begin? (e.g., minutes, hours, days)     5 days ago  2. LOCATION: Where does it hurt? (upper, mid or lower back)     Lower back 'known bulging discs' usually treated with prednisone  3. SEVERITY: How bad is the pain?  (e.g., Scale 1-10; mild, moderate, or severe)    8-10/10 at worst 4. PATTERN: Is the pain constant? (e.g., yes, no; constant, intermittent)      Intermittent  5. RADIATION: Does the pain shoot into your legs or somewhere else?     Denies  6. CAUSE:  What do you think is causing the back pain?      Bulging disks 7. BACK OVERUSE:   Any recent lifting of heavy objects, strenuous work or exercise?     denies 8. MEDICINES: What have you taken so far for the pain? (e.g., nothing, acetaminophen , NSAIDS)     none 9. NEUROLOGIC SYMPTOMS: Do you have any weakness, numbness, or problems with bowel/bladder control?     denies 10. OTHER SYMPTOMS: Do you have any other symptoms? (e.g., fever, abdomen pain, burning with urination, blood in urine)       Denies  Protocols used: Back Pain-A-AH

## 2023-10-02 ENCOUNTER — Ambulatory Visit: Admitting: Family Medicine

## 2023-10-11 ENCOUNTER — Encounter: Payer: Self-pay | Admitting: Family Medicine

## 2023-10-11 ENCOUNTER — Other Ambulatory Visit: Payer: Self-pay

## 2023-10-11 ENCOUNTER — Ambulatory Visit (INDEPENDENT_AMBULATORY_CARE_PROVIDER_SITE_OTHER): Admitting: Family Medicine

## 2023-10-11 VITALS — BP 138/84 | HR 99 | Resp 16 | Ht 70.0 in | Wt 158.1 lb

## 2023-10-11 DIAGNOSIS — G8929 Other chronic pain: Secondary | ICD-10-CM | POA: Diagnosis not present

## 2023-10-11 DIAGNOSIS — E782 Mixed hyperlipidemia: Secondary | ICD-10-CM | POA: Diagnosis not present

## 2023-10-11 DIAGNOSIS — M5441 Lumbago with sciatica, right side: Secondary | ICD-10-CM

## 2023-10-11 DIAGNOSIS — M2550 Pain in unspecified joint: Secondary | ICD-10-CM | POA: Diagnosis not present

## 2023-10-11 DIAGNOSIS — R7303 Prediabetes: Secondary | ICD-10-CM

## 2023-10-11 DIAGNOSIS — I10 Essential (primary) hypertension: Secondary | ICD-10-CM | POA: Diagnosis not present

## 2023-10-11 MED ORDER — METHYLPREDNISOLONE 4 MG PO TBPK
ORAL_TABLET | ORAL | 0 refills | Status: AC
  Filled 2023-10-11: qty 21, 6d supply, fill #0

## 2023-10-11 MED ORDER — METAXALONE 800 MG PO TABS
800.0000 mg | ORAL_TABLET | Freq: Three times a day (TID) | ORAL | 1 refills | Status: DC | PRN
  Filled 2023-10-11: qty 90, 30d supply, fill #0

## 2023-10-11 MED ORDER — LOSARTAN POTASSIUM 25 MG PO TABS
25.0000 mg | ORAL_TABLET | Freq: Every day | ORAL | 3 refills | Status: AC
Start: 2023-10-11 — End: ?
  Filled 2023-10-11: qty 90, 90d supply, fill #0

## 2023-10-11 MED ORDER — ROSUVASTATIN CALCIUM 5 MG PO TABS
5.0000 mg | ORAL_TABLET | Freq: Every day | ORAL | 3 refills | Status: DC
  Filled 2023-10-11: qty 90, 90d supply, fill #0

## 2023-10-11 MED ORDER — PREGABALIN 50 MG PO CAPS
50.0000 mg | ORAL_CAPSULE | Freq: Two times a day (BID) | ORAL | 0 refills | Status: AC
  Filled 2023-10-11: qty 180, 45d supply, fill #0

## 2023-10-11 NOTE — Progress Notes (Signed)
 Name: Troy Hogan   MRN: [id number]    DOB: May 22, 1960   Date:10/11/2023       Progress Note  Subjective  Chief Complaint  Chief Complaint  Patient presents with   Back Pain    X1 week    Discussed the use of AI scribe software for clinical note transcription with the patient, who gave verbal consent to proceed.  History of Present Illness Troy Hogan is a 63 year old male with chronic back pain who presents with worsening back pain.  He has a history of bulging discs, with severe back pain episodes occurring approximately once a year. The current episode began about a week ago, with pain so severe that he was unable to attend a scheduled appointment. The pain is chronic, with daily occurrences that sometimes flare up, causing intense pain rated as 8 out of 10 today and 10 out of 10 this morning. The pain is located in the back and occasionally radiates down the right leg during flare-ups.  He has previously been prescribed prednisone , which was effective in reducing pain within two to three days. He has also used Flexeril , which caused drowsiness, and a non-drowsy muscle relaxant that his wife had, though he is unsure of its name. He has taken gabapentin in the past, which he found somewhat effective for pain relief.  He mentions a history of taking meloxicam  and naproxen , but notes that these medications cause joint flares, which he attributes to high sodium intake. He experiences joint aches without redness, and sometimes has difficulty picking things up due to joint pain.  He has a history of chronic kidney disease diagnosed several years ago. He also has a history of mixed hyperlipidemia and essential hypertension, for which he has been prescribed medications, but he admits to not taking them regularly. He has not had a regular follow-up in a long time and is due for blood work.  He is retired from [employer] and currently works as a Gaffer, which involves physical activity that may  impact his back pain. He quit smoking a long time ago and does not currently smoke.    Patient Active Problem List   Diagnosis Date Noted   Mixed hyperlipidemia 04/23/2019   Degenerative disc disease, lumbar 04/23/2019   Erectile dysfunction 02/17/2018   Essential hypertension, benign 02/17/2018   CKD (chronic kidney disease) stage 2, GFR 60-89 ml/min 02/11/2018   Encounter for screening colonoscopy 02/07/2018    Social History   Tobacco Use   Smoking status: Former    Types: Cigars, Cigarettes   Smokeless tobacco: Former    Types: Snuff    Quit date: 12/27/2017  Substance Use Topics   Alcohol use: Yes    Alcohol/week: 3.0 standard drinks of alcohol    Types: 3 Cans of beer per week    Comment: 3 beers every other day     Current Outpatient Medications:    albuterol  (VENTOLIN  HFA) 108 (90 Base) MCG/ACT inhaler, Inhale 2 puffs into the lungs every 6 (six) hours as needed for wheezing or shortness of breath (coughing fits)., Disp: 6.7 g, Rfl: 2   aspirin  EC 81 MG tablet, Take 1 tablet (81 mg total) by mouth daily., Disp: , Rfl:    fluticasone  (FLONASE ) 50 MCG/ACT nasal spray, Place 2 sprays into both nostrils daily., Disp: 16 g, Rfl: 6   levocetirizine (XYZAL ) 5 MG tablet, Take 1 tablet (5 mg total) by mouth every evening., Disp: 90 tablet, Rfl: 1  metaxalone (SKELAXIN) 800 MG tablet, Take 1 tablet (800 mg total) by mouth 3 (three) times daily as needed for muscle spasms., Disp: 90 tablet, Rfl: 1   methylPREDNISolone  (MEDROL  DOSEPAK) 4 MG TBPK tablet, As directed, Disp: 21 tablet, Rfl: 0   pregabalin (LYRICA) 50 MG capsule, Take 1-2 capsules (50-100 mg total) by mouth 2 (two) times daily., Disp: 180 capsule, Rfl: 0   sildenafil  (REVATIO ) 20 MG tablet, Take 1-3 tabs 0.5-2 hours prior to planned sexual activity, Disp: 30 tablet, Rfl: 5   losartan  (COZAAR ) 25 MG tablet, Take 1 tablet (25 mg total) by mouth daily., Disp: 90 tablet, Rfl: 3   rosuvastatin  (CRESTOR ) 5 MG tablet, Take 1  tablet (5 mg total) by mouth at bedtime., Disp: 90 tablet, Rfl: 3  Allergies  Allergen Reactions   Codeine Nausea Only    ROS  Ten systems reviewed and is negative except as mentioned in HPI    Objective  Vitals:   10/11/23 1339  BP: 138/84  Pulse: 99  Resp: 16  SpO2: 100%  Weight: 158 lb 1.6 oz (71.7 kg)  Height: 5' 10 (1.778 m)    Body mass index is 22.68 kg/m.  Physical Exam VITALS: BP- 138/84 CONSTITUTIONAL: Patient appears well-developed and well-nourished. No distress. HEENT: Head atraumatic, normocephalic, neck supple. CARDIOVASCULAR: Normal rate, regular rhythm and normal heart sounds. No murmur heard. No BLE edema. PULMONARY: Effort normal and breath sounds normal. No respiratory distress. ABDOMINAL: There is no tenderness or distention. MUSCULOSKELETAL: Normal gait. Without gross motor or sensory deficit. Spine stiffness and limited range of motion. PSYCHIATRIC: Patient has a normal mood and affect. Behavior is normal. Judgment and thought content normal.    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 Pain is daily, with flares causing increased intensity and shooting pain down the right leg. Previous treatments included prednisone  and muscle relaxants. Gabapentin was previously used with some effect. Current pain level is 8 to 10 out of 10. - Prescribe pregabalin, starting with one dose 50mg   at night, with the option to increase to twice daily if needed. Adjust to nighttime dosing if daytime drowsiness occurs. - Prescribe Skelaxin as a non-sedating muscle relaxant, to be taken up to three times a day as needed. - Provide Medrol  Dose pack for acute inflammation management, with instructions to avoid NSAIDs during use.  Arthralgia, multiple joints Multiple joint arthralgia with stiffness and achiness, without redness or swelling. Symptoms suggest possible  inflammatory arthritis. No prior evaluation for rheumatoid arthritis or gout. Increased sodium intake exacerbates joint symptoms. - Order inflammatory panel including sed rate, C-reactive protein, and tests for lupus and rheumatoid arthritis. - Advise against NSAID use due to potential kidney impact.  Essential hypertension Essential hypertension with suboptimal medication adherence. Blood pressure recorded at 138/84 mmHg. He has not been taking antihypertensive medication regularly. - Prescribe losartan  to be taken daily to manage blood pressure and prevent future complications such as congestive heart failure. - Encourage use of a pill box to improve medication adherence.  Mixed hyperlipidemia Mixed hyperlipidemia with inconsistent medication adherence. He has not been taking rosuvastatin  regularly. - Prescribe rosuvastatin  for daily use to manage cholesterol levels. - Order lipid panel to assess current cholesterol status.

## 2023-10-15 ENCOUNTER — Telehealth: Payer: Self-pay

## 2023-10-15 ENCOUNTER — Other Ambulatory Visit: Payer: Self-pay

## 2023-10-15 ENCOUNTER — Other Ambulatory Visit: Payer: Self-pay | Admitting: Family Medicine

## 2023-10-15 MED ORDER — METHOCARBAMOL 500 MG PO TABS
500.0000 mg | ORAL_TABLET | Freq: Four times a day (QID) | ORAL | 0 refills | Status: AC | PRN
  Filled 2023-10-15: qty 120, 30d supply, fill #0

## 2023-10-15 NOTE — Telephone Encounter (Unsigned)
 Copied from CRM #[id number]. Topic: Clinical - Medication Question >> Oct 15, 2023  9:13 AM Winona R wrote: Pt calling with the name of the muscle relaxer that works for him, Methocarbamol 500mg . He stated Dr. Glenard asked him to call with the name to have it sent to the pharmacy to assist with is back pain.

## 2023-10-19 LAB — CBC WITH DIFFERENTIAL/PLATELET
Absolute Lymphocytes: 1442 {cells}/uL (ref 850–3900)
Absolute Monocytes: 567 {cells}/uL (ref 200–950)
Basophils Absolute: 32 {cells}/uL (ref 0–200)
Basophils Relative: 0.4 %
Eosinophils Absolute: 97 {cells}/uL (ref 15–500)
Eosinophils Relative: 1.2 %
HCT: 43.6 % (ref 38.5–50.0)
Hemoglobin: 14.2 g/dL (ref 13.2–17.1)
MCH: 31.7 pg (ref 27.0–33.0)
MCHC: 32.6 g/dL (ref 32.0–36.0)
MCV: 97.3 fL (ref 80.0–100.0)
MPV: 10.3 fL (ref 7.5–12.5)
Monocytes Relative: 7 %
Neutro Abs: 5962 {cells}/uL (ref 1500–7800)
Neutrophils Relative %: 73.6 %
Platelets: 271 Thousand/uL (ref 140–400)
RBC: 4.48 Million/uL (ref 4.20–5.80)
RDW: 13.5 % (ref 11.0–15.0)
Total Lymphocyte: 17.8 %
WBC: 8.1 Thousand/uL (ref 3.8–10.8)

## 2023-10-19 LAB — ANALYZER(R)ANA IFA WITH REFLEX TITER/PATTRN,SYS AUTOIMM PNL1
Anti Nuclear Antibody (ANA): POSITIVE — AB
Anticardiolipin IgA: <2 [APL'U]/mL
Anticardiolipin IgG: <2 [GPL'U]/mL
Anticardiolipin IgM: <2 [MPL'U]/mL
Beta-2 Glyco 1 IgA: <2 U/mL
Beta-2 Glyco 1 IgM: <2 U/mL
Beta-2 Glyco I IgG: <2 U/mL
C3 Complement: 123 mg/dL (ref 82–185)
C4 Complement: 24 mg/dL (ref 15–53)
Centromere Ab Screen: <1 AI
Chromatin (Nucleosomal) Antibody: <1 AI
Cyclic Citrullin Peptide Ab: <16 U
DNA Ab (DS) Crithidia, IFA: NEGATIVE
ENA SM Ab Ser-aCnc: <1 AI
Jo-1 Autoabs: <1 AI
MUTATED CITRULLINATED VIMENTIN (MCV) AB: <20 U/mL (ref <20)
Rheumatoid Factor (IgA): <5 U
Rheumatoid Factor (IgG): <5 U
Rheumatoid Factor (IgM): <5 U
Ribonucleic Protein(ENA) Antibody, IgG: <1 AI
SM/RNP: <1 AI
SSA (Ro) (ENA) Antibody, IgG: <1 AI
SSB (La) (ENA) Antibody, IgG: <1 AI
Scleroderma (Scl-70) (ENA) Antibody, IgG: <1 AI
Thyroperoxidase Ab SerPl-aCnc: 2 [IU]/mL (ref <9)

## 2023-10-19 LAB — COMPREHENSIVE METABOLIC PANEL WITH GFR
AG Ratio: 2.1 (calc) (ref 1.0–2.5)
ALT: 16 U/L (ref 9–46)
AST: 19 U/L (ref 10–35)
Albumin: 5.3 g/dL — ABNORMAL HIGH (ref 3.6–5.1)
Alkaline phosphatase (APISO): 59 U/L (ref 35–144)
BUN: 19 mg/dL (ref 7–25)
CO2: 29 mmol/L (ref 20–32)
Calcium: 10 mg/dL (ref 8.6–10.3)
Chloride: 98 mmol/L (ref 98–110)
Creat: 1.15 mg/dL (ref 0.70–1.35)
Globulin: 2.5 g/dL (ref 1.9–3.7)
Glucose, Bld: 161 mg/dL — ABNORMAL HIGH (ref 65–99)
Potassium: 4.2 mmol/L (ref 3.5–5.3)
Sodium: 138 mmol/L (ref 135–146)
Total Bilirubin: 0.4 mg/dL (ref 0.2–1.2)
Total Protein: 7.8 g/dL (ref 6.1–8.1)
eGFR: 72 mL/min/1.73m2 (ref >=60)

## 2023-10-19 LAB — HEMOGLOBIN A1C
Hgb A1c MFr Bld: 6 % — ABNORMAL HIGH (ref <5.7)
Mean Plasma Glucose: 126 mg/dL
eAG (mmol/L): 7 mmol/L

## 2023-10-19 LAB — LIPID PANEL
Cholesterol: 251 mg/dL — ABNORMAL HIGH (ref <200)
HDL: 82 mg/dL (ref >=40)
LDL Cholesterol (Calc): 138 mg/dL — ABNORMAL HIGH
Non-HDL Cholesterol (Calc): 169 mg/dL — ABNORMAL HIGH (ref <130)
Total CHOL/HDL Ratio: 3.1 (calc) (ref <5.0)
Triglycerides: 179 mg/dL — ABNORMAL HIGH (ref <150)

## 2023-10-19 LAB — ANTI-NUCLEAR AB-TITER (ANA TITER): ANA Titer 1: 1:320 {titer} — ABNORMAL HIGH

## 2023-10-22 ENCOUNTER — Ambulatory Visit: Payer: Self-pay | Admitting: Family Medicine

## 2023-10-23 ENCOUNTER — Other Ambulatory Visit: Payer: Self-pay | Admitting: Family Medicine

## 2023-10-23 ENCOUNTER — Other Ambulatory Visit: Payer: Self-pay

## 2023-10-23 MED ORDER — ATORVASTATIN CALCIUM 40 MG PO TABS
40.0000 mg | ORAL_TABLET | Freq: Every day | ORAL | 1 refills | Status: AC
  Filled 2023-10-23: qty 90, 90d supply, fill #0

## 2024-04-10 ENCOUNTER — Encounter: Admitting: Family Medicine
